# Patient Record
Sex: Female | Born: 1975 | Race: Black or African American | Hispanic: No | Marital: Married | Smoking: Never smoker
Health system: Southern US, Community
[De-identification: ages and names within clinical notes are randomized; demographics above are authoritative.]

## PROBLEM LIST (undated history)

## (undated) DIAGNOSIS — I10 Essential (primary) hypertension: Secondary | ICD-10-CM

## (undated) HISTORY — PX: TUBAL LIGATION: SHX77

## (undated) HISTORY — PX: CHOLECYSTECTOMY: SHX55

---

## 1999-11-13 ENCOUNTER — Emergency Department (HOSPITAL_COMMUNITY): Admission: EM | Admit: 1999-11-13 | Discharge: 1999-11-13 | Payer: Self-pay | Admitting: Emergency Medicine

## 1999-12-12 ENCOUNTER — Emergency Department (HOSPITAL_COMMUNITY): Admission: EM | Admit: 1999-12-12 | Discharge: 1999-12-12 | Payer: Self-pay | Admitting: Emergency Medicine

## 2000-06-20 ENCOUNTER — Other Ambulatory Visit: Admission: RE | Admit: 2000-06-20 | Discharge: 2000-06-20 | Payer: Self-pay | Admitting: Obstetrics and Gynecology

## 2000-08-04 ENCOUNTER — Ambulatory Visit (HOSPITAL_COMMUNITY): Admission: RE | Admit: 2000-08-04 | Discharge: 2000-08-04 | Payer: Self-pay | Admitting: Obstetrics and Gynecology

## 2000-08-04 ENCOUNTER — Encounter: Payer: Self-pay | Admitting: Obstetrics and Gynecology

## 2000-11-20 ENCOUNTER — Encounter: Payer: Self-pay | Admitting: Obstetrics and Gynecology

## 2000-11-20 ENCOUNTER — Ambulatory Visit (HOSPITAL_COMMUNITY): Admission: RE | Admit: 2000-11-20 | Discharge: 2000-11-20 | Payer: Self-pay | Admitting: Obstetrics and Gynecology

## 2000-11-28 ENCOUNTER — Inpatient Hospital Stay (HOSPITAL_COMMUNITY): Admission: AD | Admit: 2000-11-28 | Discharge: 2000-11-28 | Payer: Self-pay | Admitting: Obstetrics and Gynecology

## 2000-12-26 ENCOUNTER — Inpatient Hospital Stay (HOSPITAL_COMMUNITY): Admission: AD | Admit: 2000-12-26 | Discharge: 2000-12-28 | Payer: Self-pay | Admitting: Obstetrics and Gynecology

## 2001-09-16 ENCOUNTER — Other Ambulatory Visit: Admission: RE | Admit: 2001-09-16 | Discharge: 2001-09-16 | Payer: Self-pay | Admitting: Obstetrics and Gynecology

## 2001-09-30 ENCOUNTER — Ambulatory Visit (HOSPITAL_COMMUNITY): Admission: RE | Admit: 2001-09-30 | Discharge: 2001-09-30 | Payer: Self-pay | Admitting: Obstetrics and Gynecology

## 2001-09-30 ENCOUNTER — Encounter: Payer: Self-pay | Admitting: Obstetrics and Gynecology

## 2001-12-06 ENCOUNTER — Inpatient Hospital Stay (HOSPITAL_COMMUNITY): Admission: AD | Admit: 2001-12-06 | Discharge: 2001-12-06 | Payer: Self-pay | Admitting: Obstetrics and Gynecology

## 2002-01-27 ENCOUNTER — Inpatient Hospital Stay (HOSPITAL_COMMUNITY): Admission: AD | Admit: 2002-01-27 | Discharge: 2002-01-29 | Payer: Self-pay | Admitting: Obstetrics and Gynecology

## 2002-05-30 ENCOUNTER — Inpatient Hospital Stay (HOSPITAL_COMMUNITY): Admission: AD | Admit: 2002-05-30 | Discharge: 2002-05-30 | Payer: Self-pay | Admitting: Obstetrics and Gynecology

## 2002-12-09 ENCOUNTER — Encounter: Payer: Self-pay | Admitting: Obstetrics and Gynecology

## 2002-12-09 ENCOUNTER — Ambulatory Visit (HOSPITAL_COMMUNITY): Admission: RE | Admit: 2002-12-09 | Discharge: 2002-12-09 | Payer: Self-pay | Admitting: Obstetrics and Gynecology

## 2014-09-02 DIAGNOSIS — R718 Other abnormality of red blood cells: Secondary | ICD-10-CM | POA: Insufficient documentation

## 2016-11-20 ENCOUNTER — Emergency Department (HOSPITAL_COMMUNITY): Payer: BLUE CROSS/BLUE SHIELD

## 2016-11-20 ENCOUNTER — Emergency Department (HOSPITAL_COMMUNITY)
Admission: EM | Admit: 2016-11-20 | Discharge: 2016-11-20 | Disposition: A | Discharge status: Home / Self Care | Payer: BLUE CROSS/BLUE SHIELD | Attending: Physician Assistant | Admitting: Physician Assistant

## 2016-11-20 ENCOUNTER — Encounter (HOSPITAL_COMMUNITY): Payer: Self-pay | Admitting: Radiology

## 2016-11-20 DIAGNOSIS — R51 Headache: Secondary | ICD-10-CM | POA: Diagnosis not present

## 2016-11-20 DIAGNOSIS — R42 Dizziness and giddiness: Secondary | ICD-10-CM | POA: Diagnosis present

## 2016-11-20 DIAGNOSIS — Z79899 Other long term (current) drug therapy: Secondary | ICD-10-CM | POA: Insufficient documentation

## 2016-11-20 DIAGNOSIS — E876 Hypokalemia: Secondary | ICD-10-CM

## 2016-11-20 LAB — CBC
HCT: 33.4 % — ABNORMAL LOW (ref 36.0–46.0)
HEMOGLOBIN: 10.6 g/dL — AB (ref 12.0–15.0)
MCH: 22.6 pg — AB (ref 26.0–34.0)
MCHC: 31.7 g/dL (ref 30.0–36.0)
MCV: 71.1 fL — ABNORMAL LOW (ref 78.0–100.0)
Platelets: 331 10*3/uL (ref 150–400)
RBC: 4.7 MIL/uL (ref 3.87–5.11)
RDW: 15.3 % (ref 11.5–15.5)
WBC: 4.4 10*3/uL (ref 4.0–10.5)

## 2016-11-20 LAB — URINALYSIS, ROUTINE W REFLEX MICROSCOPIC
BILIRUBIN URINE: NEGATIVE
Bacteria, UA: NONE SEEN
GLUCOSE, UA: NEGATIVE mg/dL
KETONES UR: NEGATIVE mg/dL
Nitrite: NEGATIVE
Protein, ur: NEGATIVE mg/dL
Specific Gravity, Urine: 1.023 (ref 1.005–1.030)
pH: 5 (ref 5.0–8.0)

## 2016-11-20 LAB — CBG MONITORING, ED: Glucose-Capillary: 92 mg/dL (ref 65–99)

## 2016-11-20 LAB — BASIC METABOLIC PANEL
ANION GAP: 9 (ref 5–15)
BUN: 10 mg/dL (ref 6–20)
CALCIUM: 9.1 mg/dL (ref 8.9–10.3)
CHLORIDE: 103 mmol/L (ref 101–111)
CO2: 25 mmol/L (ref 22–32)
Creatinine, Ser: 0.65 mg/dL (ref 0.44–1.00)
GFR calc non Af Amer: >60 mL/min (ref >60)
Glucose, Bld: 129 mg/dL — ABNORMAL HIGH (ref 65–99)
Potassium: 2.9 mmol/L — ABNORMAL LOW (ref 3.5–5.1)
Sodium: 137 mmol/L (ref 135–145)

## 2016-11-20 LAB — I-STAT BETA HCG BLOOD, ED (MC, WL, AP ONLY): I-stat hCG, quantitative: <5 m[IU]/mL (ref <5)

## 2016-11-20 MED ORDER — ONDANSETRON HCL 4 MG/2ML IJ SOLN
4.0000 mg | Freq: Once | INTRAMUSCULAR | Status: AC
  Administered 2016-11-20: 4 mg via INTRAVENOUS
  Filled 2016-11-20: qty 2

## 2016-11-20 MED ORDER — POTASSIUM CHLORIDE CRYS ER 20 MEQ PO TBCR: 20.0000 meq | EXTENDED_RELEASE_TABLET | Freq: Every day | ORAL | 0 refills | Status: AC

## 2016-11-20 MED ORDER — POTASSIUM CHLORIDE CRYS ER 20 MEQ PO TBCR
40.0000 meq | EXTENDED_RELEASE_TABLET | Freq: Once | ORAL | Status: AC
  Administered 2016-11-20: 40 meq via ORAL
  Filled 2016-11-20: qty 2

## 2016-11-20 MED ORDER — SODIUM CHLORIDE 0.9 % IV BOLUS (SEPSIS)
1000.0000 mL | Freq: Once | INTRAVENOUS | Status: AC
  Administered 2016-11-20: 1000 mL via INTRAVENOUS

## 2016-11-20 NOTE — ED Triage Notes (Addendum)
Pt c/o abdominal pain, nausea, headache since waking up this AM. Denies any diarrhea or vomiting. States she is feeling dizzy, like she is spinning and could fall.

## 2016-11-20 NOTE — Discharge Instructions (Signed)
You have low potassium today. This can be caused by one of the medications that you're supposed to be taking, hydrochlorothiazide. Please follow-up with your primary care physician this week. Please take potassium until you can follow-up get your labs rechecked and discuss  changing your blood pressure medication.

## 2016-11-20 NOTE — ED Provider Notes (Signed)
[facility] Provider Note   CSN: [id number] Arrival date & time: 11/20/16  0549     History   Chief Complaint Chief Complaint  Patient presents with  . Headache  . Abdominal Pain    HPI Alexis Haynes is a 41 y.o. female.  HPI   Patient is a 41 year old female presenting with headache. lightheadedness is worse and she goes from sitting to standing. She reports it started today. Patient reports she's not been drinking as much fluid as usual did not have anything except Dr. Reino Kent yesterday at 4 PM. Patient denies any alcohol use. She states she has mild lightheadedness. There is no inducible dizziness with moving her head.   Mild nausea. No vomiting. No diarrhea. No recent medication changes.  History reviewed. No pertinent past medical history.  There are no active problems to display for this patient.   No past surgical history on file.  OB History    No data available       Home Medications    Prior to Admission medications   Medication Sig Start Date End Date Taking? Authorizing Provider  lisinopril-hydrochlorothiazide (PRINZIDE,ZESTORETIC) 10-12.5 MG tablet Take 2 tablets by mouth daily.   Yes [provider]  potassium chloride SA (K-DUR,KLOR-CON) 20 MEQ tablet Take 1 tablet (20 mEq total) by mouth daily. 11/20/16   Garvin Ellena, Cindee Salt, MD    Family History No family history on file.  Social History Social History  Substance Use Topics  . Smoking status: Not on file  . Smokeless tobacco: Not on file  . Alcohol use Not on file     Allergies   Patient has no known allergies.   Review of Systems Review of Systems  Constitutional: Negative for activity change.  Respiratory: Negative for shortness of breath.   Cardiovascular: Negative for chest pain.  Gastrointestinal: Negative for abdominal pain.     Physical Exam Updated Vital Signs BP (!) 148/105 (BP Location: Right Arm)   Pulse 96   Temp 97.9 F (36.6 C) (Oral)    Resp 18   Ht 5\' 5"  (1.651 m)   Wt 224 lb (101.6 kg)   SpO2 97%   BMI 37.28 kg/m   Physical Exam  Constitutional: She is oriented to person, place, and time. She appears well-developed and well-nourished.  obese  HENT:  Head: Normocephalic and atraumatic.  Eyes: Right eye exhibits no discharge.  Cardiovascular: Normal rate, regular rhythm and normal heart sounds.   No murmur heard. Pulmonary/Chest: Effort normal and breath sounds normal. She has no wheezes. She has no rales.  Abdominal: Soft. She exhibits no distension. There is no tenderness.  Neurological: She is oriented to person, place, and time.  Equal strength bilaterally upper and lower extremities negative pronator drift. Normal sensation bilaterally. Speech comprehensible, no slurring. Facial nerve tested and appears grossly normal. Alert and oriented 3.   Skin: Skin is warm and dry. She is not diaphoretic.  Psychiatric: She has a normal mood and affect.  Nursing note and vitals reviewed.    ED Treatments / Results  Labs (all labs ordered are listed, but only abnormal results are displayed) Labs Reviewed  BASIC METABOLIC PANEL - Abnormal; Notable for the following:       Result Value   Potassium 2.9 (*)    Glucose, Bld 129 (*)    All other components within normal limits  CBC - Abnormal; Notable for the following:    Hemoglobin 10.6 (*)    HCT 33.4 (*)  MCV 71.1 (*)    MCH 22.6 (*)    All other components within normal limits  URINALYSIS, ROUTINE W REFLEX MICROSCOPIC - Abnormal; Notable for the following:    APPearance CLOUDY (*)    Hgb urine dipstick LARGE (*)    Leukocytes, UA LARGE (*)    Squamous Epithelial / LPF TOO NUMEROUS TO COUNT (*)    All other components within normal limits  CBG MONITORING, ED  I-STAT BETA HCG BLOOD, ED ([facility], [facility], [facility] ONLY)    EKG  EKG Interpretation None       Radiology Ct Head Wo Contrast  Result Date: 11/20/2016 CLINICAL DATA:  Headache, lightheadedness worse when  going from sitting to standing beginning today, decreased fluid intake EXAM: CT HEAD WITHOUT CONTRAST TECHNIQUE: Contiguous axial images were obtained from the base of the skull through the vertex without intravenous contrast. Sagittal and coronal MPR images reconstructed from axial data set. COMPARISON:  None FINDINGS: Brain: Normal ventricular morphology. No midline shift or mass effect. Normal appearance of brain parenchyma. No intracranial hemorrhage, mass lesion or evidence acute infarction. No extra-axial fluid collections. Vascular: Unremarkable Skull: Intact.  Few scattered nonspecific dural calcifications. Sinuses/Orbits: Clear Other: N/A IMPRESSION: No acute intracranial abnormalities. Electronically Signed   By: Ulyses SouthwardMark  Boles M.D.   On: 11/20/2016 09:14    Procedures Procedures (including critical care time)  Medications Ordered in ED Medications  sodium chloride 0.9 % bolus 1,000 mL (1,000 mLs Intravenous New Bag/Given 11/20/16 0859)  potassium chloride SA (K-DUR,KLOR-CON) CR tablet 40 mEq (40 mEq Oral Given 11/20/16 0900)  ondansetron (ZOFRAN) injection 4 mg (4 mg Intravenous Given 11/20/16 0900)     Initial Impression / Assessment and Plan / ED Course  I have reviewed the triage vital signs and the nursing notes.  Pertinent labs & imaging results that were available during my care of the patient were reviewed by me and considered in my medical decision making (see chart for details).     Patient is a 41 year old female presenting with mild headache and mild lightheadedness when going from sitting to standing. With this represents dehydration. The headache started within the last 2 hours. Therefore CT will rule out any chance of aneurysm. We will give patient fluids and see this symptomatically helps her. Patient's potassium low, we'll replete. We'll have her retested with her primary care physician in a week's time.  \\11:02 AM Patient reports that she occasionally takes  hydrochlorothiazide. We will instruct her to take potassium and then have her labs rechecked and discuss changing her blood pressure medication with her primary care physician.  Final Clinical Impressions(s) / ED Diagnoses   Final diagnoses:  Hypokalemia    New Prescriptions New Prescriptions   POTASSIUM CHLORIDE SA (K-DUR,KLOR-CON) 20 MEQ TABLET    Take 1 tablet (20 mEq total) by mouth daily.     Abelino DerrickMackuen, Shaden Lacher Lyn, MD 11/20/16 1102

## 2016-11-20 NOTE — ED Notes (Signed)
Patient transported to CT 

## 2017-05-15 ENCOUNTER — Ambulatory Visit: Payer: Self-pay | Admitting: Family Medicine

## 2017-05-29 ENCOUNTER — Ambulatory Visit: Payer: Self-pay | Admitting: Family Medicine

## 2017-06-06 ENCOUNTER — Ambulatory Visit (HOSPITAL_COMMUNITY)
Admission: EM | Admit: 2017-06-06 | Discharge: 2017-06-06 | Disposition: A | Discharge status: Home / Self Care | Payer: Medicaid Other | Attending: Internal Medicine | Admitting: Internal Medicine

## 2017-06-06 ENCOUNTER — Encounter (HOSPITAL_COMMUNITY): Payer: Self-pay | Admitting: Emergency Medicine

## 2017-06-06 DIAGNOSIS — J019 Acute sinusitis, unspecified: Secondary | ICD-10-CM

## 2017-06-06 DIAGNOSIS — J029 Acute pharyngitis, unspecified: Secondary | ICD-10-CM

## 2017-06-06 DIAGNOSIS — R05 Cough: Secondary | ICD-10-CM

## 2017-06-06 DIAGNOSIS — R110 Nausea: Secondary | ICD-10-CM

## 2017-06-06 HISTORY — DX: Essential (primary) hypertension: I10

## 2017-06-06 MED ORDER — AMOXICILLIN 500 MG PO CAPS: 500.0000 mg | ORAL_CAPSULE | Freq: Three times a day (TID) | ORAL | 0 refills | Status: AC

## 2017-06-06 NOTE — ED Triage Notes (Signed)
Pt here with URI sx and fever x 3 days

## 2017-06-06 NOTE — Discharge Instructions (Signed)
You likely having a viral upper respiratory infection. We recommended symptom control. I expect your symptoms to start improving in the next 1-2 weeks. I have also sent in an antibiotic for you to fill in 5 days if you are not improving.   1. Take a daily allergy pill like Zyrtec, Claritin, or Store brand (anti-histamine) consistently for 2 weeks  2. For congestion you may try an oral decongestant like Mucinex or sudafed. You may also try flonase or saline irrigations.   3. For your sore throat you may try cepacol lozenges, salt water gargles  4. Take Tylenol or Ibuprofen to help with pain/inflammation  5. Stay Hydrated! Drink plenty of fluids.

## 2017-06-06 NOTE — ED Provider Notes (Signed)
[facility]    CSN: [id number] Arrival date & time: 06/06/17  1417     History   Chief Complaint Chief Complaint  Patient presents with  . URI    HPI Alexis Haynes is a 41 y.o. female presenting with URI symptoms and fever. She has had nasal congestion, headache, facial pressure, loss of appetite, cough, and a hoarse voice for 2-3 days. She also reports loss of appetite and nausea with out vomiting. She has taken over the counter cold symptoms medicine and a generic mucinex. Both seemed to minimilly help her symptoms. No chest pain or belly pain.    She has PMH of HTN, has stopped her medications over the past 2 days for concern of medication interference.  HPI  Past Medical History:  Diagnosis Date  . Hypertension     There are no active problems to display for this patient.   History reviewed. No pertinent surgical history.  OB History    No data available       Home Medications    Prior to Admission medications   Medication Sig Start Date End Date Taking? Authorizing Provider  amoxicillin (AMOXIL) 500 MG capsule Take 1 capsule (500 mg total) by mouth 3 (three) times daily for 5 days. Please do not fill until 11/26 06/06/17 06/11/17  Masen Luallen, Fran LowesHallie C, PA-C  lisinopril-hydrochlorothiazide (PRINZIDE,ZESTORETIC) 10-12.5 MG tablet Take 2 tablets by mouth daily.    [provider]  potassium chloride SA (K-DUR,KLOR-CON) 20 MEQ tablet Take 1 tablet (20 mEq total) by mouth daily. 11/20/16   Mackuen, Cindee Saltourteney Lyn, MD    Family History History reviewed. No pertinent family history.  Social History Social History   Tobacco Use  . Smoking status: Never Smoker  . Smokeless tobacco: Never Used  Substance Use Topics  . Alcohol use: No    Frequency: Never  . Drug use: No     Allergies   Patient has no known allergies.   Review of Systems Review of Systems  Constitutional: Positive for appetite change, chills and fever.  HENT:  Positive for congestion, rhinorrhea, sinus pressure and sore throat. Negative for ear pain and trouble swallowing.   Respiratory: Positive for cough. Negative for chest tightness.   Cardiovascular: Negative for chest pain.  Gastrointestinal: Positive for nausea. Negative for abdominal pain and vomiting.  Musculoskeletal: Negative for myalgias.     Physical Exam Triage Vital Signs ED Triage Vitals [06/06/17 1432]  Enc Vitals Group     BP (!) 149/100     Pulse Rate (!) 113     Resp 18     Temp 99.3 F (37.4 C)     Temp Source Oral     SpO2 100 %     Weight      Height      Head Circumference      Peak Flow      Pain Score      Pain Loc      Pain Edu?      Excl. in GC?    No data found.  Updated Vital Signs BP (!) 149/100 (BP Location: Right Arm)   Pulse (!) 113   Temp 99.3 F (37.4 C) (Oral)   Resp 18   SpO2 100%    Physical Exam  Constitutional: She is oriented to person, place, and time. She appears well-developed and well-nourished.  Appearing moderately ill and uncomfortable.  HENT:  Head: Normocephalic and atraumatic.  Right Ear: Tympanic membrane and ear  canal normal.  Left Ear: Tympanic membrane and ear canal normal.  Nose: Rhinorrhea present. Right sinus exhibits maxillary sinus tenderness. Left sinus exhibits maxillary sinus tenderness.  Mouth/Throat: Oropharyngeal exudate present.  Eyes: EOM are normal.  Neck: Normal range of motion.  Cardiovascular: Regular rhythm.  No murmur heard. tachycardic  Pulmonary/Chest: Effort normal and breath sounds normal. No stridor. She has no wheezes. She has no rales.  Abdominal: Soft. She exhibits no distension. There is no tenderness.  Lymphadenopathy:    She has no cervical adenopathy.  Neurological: She is alert and oriented to person, place, and time.  Skin: Skin is warm.      UC Treatments / Results  Labs (all labs ordered are listed, but only abnormal results are displayed) Labs Reviewed - No data to  display  EKG  EKG Interpretation None       Radiology No results found.  Procedures Procedures (including critical care time)  Medications Ordered in UC Medications - No data to display   Initial Impression / Assessment and Plan / UC Course  I have reviewed the triage vital signs and the nursing notes.  Pertinent labs & imaging results that were available during my care of the patient were reviewed by me and considered in my medical decision making (see chart for details).    Viral URI vs sinusitis vs. Flu  Patient was recommended symptomatic treatment as her symptoms have only persisted 2-3 days. She was given amoxicillin to fill in 5 days if she is not feeling any better. Continue Tylenol or ibuprofen for fever/pain/inflammation. Encouraged to stay hydrated and eat blander foods that may sit better on her stomach. Continue symptom control with anti-histamine, decongestant, flonase, saline irrigation. Advised to return if symptoms not improving in 1 week.   Elevated pressure and HR: may be related to not take BP medication for past 2 days. Advised to begin today and monitor.   Final Clinical Impressions(s) / UC Diagnoses   Final diagnoses:  Acute non-recurrent sinusitis, unspecified location    ED Discharge Orders        Ordered    amoxicillin (AMOXIL) 500 MG capsule  3 times daily     06/06/17 1634       Controlled Substance Prescriptions [place] Controlled Substance Registry consulted? Not Applicable   Lew DawesWieters, Niesha Bame C, PA-C 06/06/17 [address]1650    Dawood Spitler, [place] SquareHallie C, [place]PA-C 06/06/17 1733

## 2017-09-17 ENCOUNTER — Ambulatory Visit: Payer: Self-pay | Admitting: Physician Assistant

## 2017-09-19 ENCOUNTER — Other Ambulatory Visit: Payer: Self-pay

## 2017-09-19 ENCOUNTER — Ambulatory Visit (INDEPENDENT_AMBULATORY_CARE_PROVIDER_SITE_OTHER): Payer: Commercial Managed Care - PPO | Admitting: Physician Assistant

## 2017-09-19 ENCOUNTER — Encounter: Payer: Self-pay | Admitting: Physician Assistant

## 2017-09-19 VITALS — BP 128/104 | HR 94 | Temp 98.9°F | Resp 16 | Ht 65.0 in | Wt 229.8 lb

## 2017-09-19 DIAGNOSIS — I10 Essential (primary) hypertension: Secondary | ICD-10-CM | POA: Diagnosis not present

## 2017-09-19 DIAGNOSIS — Z13220 Encounter for screening for lipoid disorders: Secondary | ICD-10-CM

## 2017-09-19 DIAGNOSIS — E669 Obesity, unspecified: Secondary | ICD-10-CM

## 2017-09-19 DIAGNOSIS — Z6838 Body mass index (BMI) 38.0-38.9, adult: Secondary | ICD-10-CM

## 2017-09-19 DIAGNOSIS — E66812 Obesity, class 2: Secondary | ICD-10-CM

## 2017-09-19 MED ORDER — LISINOPRIL-HYDROCHLOROTHIAZIDE 20-12.5 MG PO TABS: 1.0000 | ORAL_TABLET | Freq: Every day | ORAL | 3 refills | Status: AC

## 2017-09-19 NOTE — Patient Instructions (Addendum)
Please call Dr. Philmore Pali office for follow-up on your anemia - find a new provider in the area.  Phone: [phone]   See below for food tips to help lower your blood pressure.  Exercise also helps lower your blood pressure. Try and start walking 10-30 min daily.  STOP taking lisinopril-HCTZ 10-12.5mg .  START TAKING lisinopril-HCTZ 20-12.5mg  once daily.  Come back and see me in 4-6 weeks for blood pressure recheck.   DASH Eating Plan DASH stands for "Dietary Approaches to Stop Hypertension." The DASH eating plan is a healthy eating plan that has been shown to reduce high blood pressure (hypertension). It may also reduce your risk for type 2 diabetes, heart disease, and stroke. The DASH eating plan may also help with weight loss. What are tips for following this plan? General guidelines  Avoid eating more than 2,300 mg (milligrams) of salt (sodium) a day. If you have hypertension, you may need to reduce your sodium intake to 1,500 mg a day.  Limit alcohol intake to no more than 1 drink a day for nonpregnant women and 2 drinks a day for men. One drink equals 12 oz of beer, 5 oz of wine, or 1 oz of hard liquor.  Work with your health care provider to maintain a healthy body weight or to lose weight. Ask what an ideal weight is for you.  Get at least 30 minutes of exercise that causes your heart to beat faster (aerobic exercise) most days of the week. Activities may include walking, swimming, or biking.  Work with your health care provider or diet and nutrition specialist (dietitian) to adjust your eating plan to your individual calorie needs. Reading food labels  Check food labels for the amount of sodium per serving. Choose foods with less than 5 percent of the Daily Value of sodium. Generally, foods with less than 300 mg of sodium per serving fit into this eating plan.  To find whole grains, look for the word "whole" as the first word in the ingredient list. Shopping  Buy products  labeled as "low-sodium" or "no salt added."  Buy fresh foods. Avoid canned foods and premade or frozen meals. Cooking  Avoid adding salt when cooking. Use salt-free seasonings or herbs instead of table salt or sea salt. Check with your health care provider or pharmacist before using salt substitutes.  Do not fry foods. Cook foods using healthy methods such as baking, boiling, grilling, and broiling instead.  Cook with heart-healthy oils, such as olive, canola, soybean, or sunflower oil. Meal planning   Eat a balanced diet that includes: ? 5 or more servings of fruits and vegetables each day. At each meal, try to fill half of your plate with fruits and vegetables. ? Up to 6-8 servings of whole grains each day. ? Less than 6 oz of lean meat, poultry, or fish each day. A 3-oz serving of meat is about the same size as a deck of cards. One egg equals 1 oz. ? 2 servings of low-fat dairy each day. ? A serving of nuts, seeds, or beans 5 times each week. ? Heart-healthy fats. Healthy fats called Omega-3 fatty acids are found in foods such as flaxseeds and coldwater fish, like sardines, salmon, and mackerel.  Limit how much you eat of the following: ? Canned or prepackaged foods. ? Food that is high in trans fat, such as fried foods. ? Food that is high in saturated fat, such as fatty meat. ? Sweets, desserts, sugary drinks, and other foods  with added sugar. ? Full-fat dairy products.  Do not salt foods before eating.  Try to eat at least 2 vegetarian meals each week.  Eat more home-cooked food and less restaurant, buffet, and fast food.  When eating at a restaurant, ask that your food be prepared with less salt or no salt, if possible. What foods are recommended? The items listed may not be a complete list. Talk with your dietitian about what dietary choices are best for you. Grains Whole-grain or whole-wheat bread. Whole-grain or whole-wheat pasta. Brown rice. [place]ena Morrow. Bulgur.  Whole-grain and low-sodium cereals. Pita bread. Low-fat, low-sodium crackers. Whole-wheat flour tortillas. Vegetables Fresh or frozen vegetables (raw, steamed, roasted, or grilled). Low-sodium or reduced-sodium tomato and vegetable juice. Low-sodium or reduced-sodium tomato sauce and tomato paste. Low-sodium or reduced-sodium canned vegetables. Fruits All fresh, dried, or frozen fruit. Canned fruit in natural juice (without added sugar). Meat and other protein foods Skinless chicken or [place]. Ground chicken or [place]. Pork with fat trimmed off. Fish and seafood. Egg whites. Dried beans, peas, or lentils. Unsalted nuts, nut butters, and seeds. Unsalted canned beans. Lean cuts of beef with fat trimmed off. Low-sodium, lean deli meat. Dairy Low-fat (1%) or fat-free (skim) milk. Fat-free, low-fat, or reduced-fat cheeses. Nonfat, low-sodium ricotta or cottage cheese. Low-fat or nonfat yogurt. Low-fat, low-sodium cheese. Fats and oils Soft margarine without trans fats. Vegetable oil. Low-fat, reduced-fat, or light mayonnaise and salad dressings (reduced-sodium). Canola, safflower, olive, soybean, and sunflower oils. Avocado. Seasoning and other foods Herbs. Spices. Seasoning mixes without salt. Unsalted popcorn and pretzels. Fat-free sweets. What foods are not recommended? The items listed may not be a complete list. Talk with your dietitian about what dietary choices are best for you. Grains Baked goods made with fat, such as croissants, muffins, or some breads. Dry pasta or rice meal packs. Vegetables Creamed or fried vegetables. Vegetables in a cheese sauce. Regular canned vegetables (not low-sodium or reduced-sodium). Regular canned tomato sauce and paste (not low-sodium or reduced-sodium). Regular tomato and vegetable juice (not low-sodium or reduced-sodium). Angie Fava. Olives. Fruits Canned fruit in a light or heavy syrup. Fried fruit. Fruit in cream or butter sauce. Meat and other protein  foods Fatty cuts of meat. Ribs. Fried meat. Berniece Salines. Sausage. Bologna and other processed lunch meats. Salami. Fatback. Hotdogs. Bratwurst. Salted nuts and seeds. Canned beans with added salt. Canned or smoked fish. Whole eggs or egg yolks. Chicken or [place] with skin. Dairy Whole or 2% milk, cream, and half-and-half. Whole or full-fat cream cheese. Whole-fat or sweetened yogurt. Full-fat cheese. Nondairy creamers. Whipped toppings. Processed cheese and cheese spreads. Fats and oils Butter. Stick margarine. Lard. Shortening. Ghee. Bacon fat. Tropical oils, such as coconut, palm kernel, or palm oil. Seasoning and other foods Salted popcorn and pretzels. Onion salt, garlic salt, seasoned salt, table salt, and sea salt. Worcestershire sauce. Tartar sauce. Barbecue sauce. Teriyaki sauce. Soy sauce, including reduced-sodium. Steak sauce. Canned and packaged gravies. Fish sauce. Oyster sauce. Cocktail sauce. Horseradish that you find on the shelf. Ketchup. Mustard. Meat flavorings and tenderizers. Bouillon cubes. Hot sauce and Tabasco sauce. Premade or packaged marinades. Premade or packaged taco seasonings. Relishes. Regular salad dressings. Where to find more information:  National Heart, Lung, and [place]: https://wilson-eaton.com/  American Heart Association: www.heart.org Summary  The DASH eating plan is a healthy eating plan that has been shown to reduce high blood pressure (hypertension). It may also reduce your risk for type 2 diabetes, heart disease, and stroke.  With the  DASH eating plan, you should limit salt (sodium) intake to 2,300 mg a day. If you have hypertension, you may need to reduce your sodium intake to 1,500 mg a day.  When on the DASH eating plan, aim to eat more fresh fruits and vegetables, whole grains, lean proteins, low-fat dairy, and heart-healthy fats.  Work with your health care provider or diet and nutrition specialist (dietitian) to adjust your eating plan to your  individual calorie needs. This information is not intended to replace advice given to you by your health care provider. Make sure you discuss any questions you have with your health care provider. Document Released: 06/20/2011 Document Revised: 06/24/2016 Document Reviewed: 06/24/2016 Elsevier Interactive Patient Education  2018 [employer]  IF you received an x-ray today, you will receive an invoice from [facility]. Please contact [facility] at [phone] with questions or concerns regarding your invoice.   IF you received labwork today, you will receive an invoice from [facility]. Please contact [facility] at [phone] with questions or concerns regarding your invoice.   Our billing staff will not be able to assist you with questions regarding bills from these companies.  You will be contacted with the lab results as soon as they are available. The fastest way to get your results is to activate your My Chart account. Instructions are located on the last page of this paperwork. If you have not heard from us regarding the results in 2 weeks, please contact this office.

## 2017-09-19 NOTE — Progress Notes (Signed)
   Alexis Haynes  MRN: [id number] DOB: [birth year]  PCP: Patient, No Pcp Per  Subjective:  Pt is a 42 year old female PMH microcytic anemia, HTN who presents to clinic for HTN f/u.   HTN - Lisinopril-HCTZ 10-12.5 mg she is taking 2 tabs daily. Today's blood pressure is 130/92. Last dose was yesterday. Does not take bp's outside of clinic. She has been trying to stretch her medication out and taking one pill once daily. When she takes 2 pills she urinates more frequently.  She is not fasting today - last ate about 4 hours ago.  Does not eat a healthy diet Does not exercise.  Denies cardiac symptoms. Received care in [place], however recently moved to [place].   Review of Systems  Constitutional: Negative for chills, diaphoresis, fatigue and fever.  Respiratory: Negative for cough, chest tightness, shortness of breath and wheezing.   Cardiovascular: Negative for chest pain and palpitations.  Gastrointestinal: Negative for abdominal pain, diarrhea, nausea and vomiting.  Genitourinary: Positive for frequency.  Neurological: Negative for weakness, light-headedness and headaches.    There are no active problems to display for this patient.   Current Outpatient Medications on File Prior to Visit  Medication Sig Dispense Refill  . lisinopril-hydrochlorothiazide (PRINZIDE,ZESTORETIC) 10-12.5 MG tablet Take 2 tablets by mouth daily.    . potassium chloride SA (K-DUR,KLOR-CON) 20 MEQ tablet Take 1 tablet (20 mEq total) by mouth daily. (Patient not taking: Reported on 09/19/2017) 15 tablet 0   No current facility-administered medications on file prior to visit.     No Known Allergies   Objective:  BP (!) 130/92   Pulse 94   Temp 98.9 F (37.2 C) (Oral)   Resp 16   Ht '5\' 5"'$  (1.651 m)   Wt 229 lb 12.8 oz (104.2 kg)   SpO2 99%   BMI 38.24 kg/m   Physical Exam  Constitutional: She is oriented to person, place, and time and well-developed, well-nourished, and in no distress. No  distress.  obese  Cardiovascular: Normal rate, regular rhythm and normal heart sounds.  Neurological: She is alert and oriented to person, place, and time. GCS score is 15.  Skin: Skin is warm and dry.  Psychiatric: Mood, memory, affect and judgment normal.  Vitals reviewed.   Assessment and Plan :  1. Essential hypertension - CMP14+EGFR - POCT urinalysis dipstick - lisinopril-hydrochlorothiazide (ZESTORETIC) 20-12.5 MG tablet; Take 1 tablet by mouth daily.  Dispense: 30 tablet; Refill: 3 - Pt presents for HTN f/u. She is a new patient here. She has a h/o microcytic anemia which she was followed by oncology at [facility]. She has yet to find provider here - number provided for her former provider at [facility], she will call to find referral. Routine labs are pending, will contact with results. She has not been taking HTN medication as directed. Today's blood pressure is 128/104. Plan to switch dose to Zestoretic 20-12.'5mg'$ . RTC in 4 weeks for bp recheck.  2. Screening, lipid 3. Class 2 obesity with body mass index (BMI) of 38.0 to 38.9 in adult, unspecified obesity type, unspecified whether serious comorbidity present - Lipid panel   Mercer Pod, PA-C  [facility] 09/19/2017 4:28 PM

## 2017-09-20 LAB — CMP14+EGFR
ALT: 24 IU/L (ref 0–32)
AST: 20 IU/L (ref 0–40)
Albumin/Globulin Ratio: 1.4 (ref 1.2–2.2)
Albumin: 4.4 g/dL (ref 3.5–5.5)
Alkaline Phosphatase: 85 IU/L (ref 39–117)
BUN/Creatinine Ratio: 9 (ref 9–23)
BUN: 6 mg/dL (ref 6–24)
Bilirubin Total: <0.2 mg/dL (ref 0.0–1.2)
CO2: 23 mmol/L (ref 20–29)
Calcium: 9.7 mg/dL (ref 8.7–10.2)
Chloride: 101 mmol/L (ref 96–106)
Creatinine, Ser: 0.65 mg/dL (ref 0.57–1.00)
GFR calc Af Amer: 128 mL/min/{1.73_m2} (ref >59)
GFR calc non Af Amer: 111 mL/min/{1.73_m2} (ref >59)
Globulin, Total: 3.2 g/dL (ref 1.5–4.5)
Glucose: 115 mg/dL — ABNORMAL HIGH (ref 65–99)
Potassium: 3.8 mmol/L (ref 3.5–5.2)
Sodium: 139 mmol/L (ref 134–144)
Total Protein: 7.6 g/dL (ref 6.0–8.5)

## 2017-09-20 LAB — LIPID PANEL
Chol/HDL Ratio: 4.3 ratio (ref 0.0–4.4)
Cholesterol, Total: 197 mg/dL (ref 100–199)
HDL: 46 mg/dL (ref >39)
LDL Calculated: 120 mg/dL — ABNORMAL HIGH (ref 0–99)
Triglycerides: 154 mg/dL — ABNORMAL HIGH (ref 0–149)
VLDL Cholesterol Cal: 31 mg/dL (ref 5–40)

## 2017-09-23 ENCOUNTER — Encounter: Payer: Self-pay | Admitting: Physician Assistant

## 2017-09-23 NOTE — Progress Notes (Signed)
Result letter sent in mail. Advised improved diet and exercise. RTC in one year for annual and fasting labs.

## 2017-10-28 ENCOUNTER — Telehealth: Payer: Self-pay | Admitting: Physician Assistant

## 2017-10-28 NOTE — Telephone Encounter (Signed)
Copied from CRM [phone]. Topic: Quick Communication - Lab Results >> Oct 28, 2017  3:37 PM Alexis Haynes, Alexis Haynes wrote: Patient called and said that she would [facility] C[facility] regarding labs that she had done from her visit in March. Please call patient back, thanks.

## 2017-10-29 NOTE — Telephone Encounter (Signed)
Pt given results per notes of Alexis ShoutsElizabeth Haynes, [place] on 09/23/17, patient verbalized understanding and says she never received her lab results in the mail.  I asked about scheduling the yearly physical appointment for anytime after 09/20/2018, she says she will call back later for the appointment.

## 2017-10-29 NOTE — Telephone Encounter (Signed)
Phone call to patient. Unable to reach. If patient calls back, please ask what questions she had regarding her labs.

## 2017-11-03 ENCOUNTER — Emergency Department (HOSPITAL_COMMUNITY)
Admission: EM | Admit: 2017-11-03 | Discharge: 2017-11-04 | Disposition: A | Discharge status: Home / Self Care | Payer: Commercial Managed Care - PPO | Attending: Emergency Medicine | Admitting: Emergency Medicine

## 2017-11-03 ENCOUNTER — Other Ambulatory Visit: Payer: Self-pay

## 2017-11-03 ENCOUNTER — Emergency Department (HOSPITAL_COMMUNITY): Payer: Commercial Managed Care - PPO

## 2017-11-03 ENCOUNTER — Encounter (HOSPITAL_COMMUNITY): Payer: Self-pay | Admitting: Emergency Medicine

## 2017-11-03 DIAGNOSIS — I10 Essential (primary) hypertension: Secondary | ICD-10-CM | POA: Insufficient documentation

## 2017-11-03 DIAGNOSIS — Y92410 Unspecified street and highway as the place of occurrence of the external cause: Secondary | ICD-10-CM | POA: Diagnosis not present

## 2017-11-03 DIAGNOSIS — Y939 Activity, unspecified: Secondary | ICD-10-CM | POA: Insufficient documentation

## 2017-11-03 DIAGNOSIS — Z79899 Other long term (current) drug therapy: Secondary | ICD-10-CM | POA: Diagnosis not present

## 2017-11-03 DIAGNOSIS — Y999 Unspecified external cause status: Secondary | ICD-10-CM | POA: Diagnosis not present

## 2017-11-03 DIAGNOSIS — S161XXA Strain of muscle, fascia and tendon at neck level, initial encounter: Secondary | ICD-10-CM | POA: Insufficient documentation

## 2017-11-03 DIAGNOSIS — S199XXA Unspecified injury of neck, initial encounter: Secondary | ICD-10-CM | POA: Diagnosis present

## 2017-11-03 MED ORDER — METHOCARBAMOL 500 MG PO TABS: 500.0000 mg | ORAL_TABLET | Freq: Two times a day (BID) | ORAL | 0 refills | Status: AC
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
## 2017-11-11 ENCOUNTER — Telehealth: Payer: Self-pay | Admitting: Oncology

## 2017-11-11 ENCOUNTER — Encounter: Payer: Self-pay | Admitting: Oncology

## 2017-11-11 NOTE — Telephone Encounter (Signed)
Appt has been scheduled for the pt to see Dr. Clelia Croft on 5/24 at 11am. Pt aware to arrive 30 minutes early. Pt is a transfer from [facility]. Letter mailed.

## 2017-11-14 ENCOUNTER — Telehealth: Payer: Self-pay | Admitting: Hematology and Oncology

## 2017-11-14 ENCOUNTER — Encounter: Payer: Self-pay | Admitting: Hematology and Oncology

## 2017-11-14 NOTE — Telephone Encounter (Signed)
A provider change has been made to the pt's appt. Pt will now be seeing Dr. Gweneth Dimitri instead of Dr. Clelia Croft on 5/24 at 11am. Letter mailed to the pt.

## 2017-12-01 ENCOUNTER — Inpatient Hospital Stay: Payer: Commercial Managed Care - PPO

## 2017-12-01 ENCOUNTER — Inpatient Hospital Stay: Payer: Commercial Managed Care - PPO | Attending: Hematology and Oncology | Admitting: Hematology and Oncology

## 2017-12-01 ENCOUNTER — Encounter: Payer: Self-pay | Admitting: Hematology and Oncology

## 2017-12-01 VITALS — BP 131/102 | HR 99 | Temp 98.7°F | Resp 18 | Ht 68.0 in | Wt 231.0 lb

## 2017-12-01 DIAGNOSIS — R700 Elevated erythrocyte sedimentation rate: Secondary | ICD-10-CM | POA: Diagnosis not present

## 2017-12-01 DIAGNOSIS — R109 Unspecified abdominal pain: Secondary | ICD-10-CM | POA: Diagnosis not present

## 2017-12-01 DIAGNOSIS — N920 Excessive and frequent menstruation with regular cycle: Secondary | ICD-10-CM | POA: Diagnosis not present

## 2017-12-01 DIAGNOSIS — I10 Essential (primary) hypertension: Secondary | ICD-10-CM

## 2017-12-01 DIAGNOSIS — K921 Melena: Secondary | ICD-10-CM | POA: Diagnosis not present

## 2017-12-01 DIAGNOSIS — D509 Iron deficiency anemia, unspecified: Secondary | ICD-10-CM | POA: Diagnosis not present

## 2017-12-01 DIAGNOSIS — Z79899 Other long term (current) drug therapy: Secondary | ICD-10-CM | POA: Diagnosis not present

## 2017-12-01 LAB — SEDIMENTATION RATE: Sed Rate: 32 mm/hr — ABNORMAL HIGH (ref 0–22)

## 2017-12-01 LAB — CMP (CANCER CENTER ONLY)
ALBUMIN: 3.8 g/dL (ref 3.5–5.0)
ALK PHOS: 87 U/L (ref 40–150)
ALT: 22 U/L (ref 0–55)
ANION GAP: 7 (ref 3–11)
AST: 14 U/L (ref 5–34)
BUN: 6 mg/dL — ABNORMAL LOW (ref 7–26)
CO2: 26 mmol/L (ref 22–29)
Calcium: 9.6 mg/dL (ref 8.4–10.4)
Chloride: 109 mmol/L (ref 98–109)
Creatinine: 0.7 mg/dL (ref 0.60–1.10)
GFR, Est AFR Am: >60 mL/min (ref >60)
GFR, Estimated: >60 mL/min (ref >60)
GLUCOSE: 106 mg/dL (ref 70–140)
POTASSIUM: 3.6 mmol/L (ref 3.5–5.1)
Sodium: 142 mmol/L (ref 136–145)
TOTAL PROTEIN: 7.7 g/dL (ref 6.4–8.3)
Total Bilirubin: <0.2 mg/dL — ABNORMAL LOW (ref 0.2–1.2)

## 2017-12-01 LAB — CBC WITH DIFFERENTIAL (CANCER CENTER ONLY)
BASOS ABS: 0.1 10*3/uL (ref 0.0–0.1)
Basophils Relative: 1 %
Eosinophils Absolute: 0.2 10*3/uL (ref 0.0–0.5)
Eosinophils Relative: 3 %
HEMATOCRIT: 32.9 % — AB (ref 34.8–46.6)
HEMOGLOBIN: 10.4 g/dL — AB (ref 11.6–15.9)
LYMPHS PCT: 41 %
Lymphs Abs: 2.1 10*3/uL (ref 0.9–3.3)
MCH: 22.4 pg — ABNORMAL LOW (ref 25.1–34.0)
MCHC: 31.7 g/dL (ref 31.5–36.0)
MCV: 70.9 fL — AB (ref 79.5–101.0)
MONO ABS: 0.2 10*3/uL (ref 0.1–0.9)
MONOS PCT: 5 %
NEUTROS ABS: 2.5 10*3/uL (ref 1.5–6.5)
NEUTROS PCT: 50 %
Platelet Count: 388 10*3/uL (ref 145–400)
RBC: 4.64 MIL/uL (ref 3.70–5.45)
RDW: 16.6 % — AB (ref 11.2–14.5)
WBC Count: 5.1 10*3/uL (ref 3.9–10.3)

## 2017-12-01 LAB — C-REACTIVE PROTEIN: CRP: <0.8 mg/dL (ref <1.0)

## 2017-12-02 LAB — IRON AND TIBC
IRON: 20 ug/dL — AB (ref 41–142)
Saturation Ratios: 7 % — ABNORMAL LOW (ref 21–57)
TIBC: 288 ug/dL (ref 236–444)
UIBC: 268 ug/dL

## 2017-12-02 LAB — ANTINUCLEAR ANTIBODIES, IFA: ANTINUCLEAR ANTIBODIES, IFA: NEGATIVE

## 2017-12-02 LAB — C3 AND C4
C3 COMPLEMENT: 146 mg/dL (ref 82–167)
COMPLEMENT C4, BODY FLUID: 34 mg/dL (ref 14–44)

## 2017-12-02 LAB — FERRITIN: FERRITIN: 85 ng/mL (ref 9–269)

## 2017-12-02 LAB — CERULOPLASMIN: Ceruloplasmin: 28.4 mg/dL (ref 19.0–39.0)

## 2017-12-02 LAB — RHEUMATOID FACTOR

## 2017-12-03 LAB — ZINC: Zinc: 84 ug/dL (ref 56–134)

## 2017-12-03 LAB — COPPER, SERUM: COPPER: 121 ug/dL (ref 72–166)

## 2017-12-05 ENCOUNTER — Encounter: Payer: Medicaid Other | Admitting: Oncology

## 2017-12-05 ENCOUNTER — Encounter: Payer: Medicaid Other | Admitting: Hematology and Oncology

## 2017-12-11 ENCOUNTER — Inpatient Hospital Stay (HOSPITAL_BASED_OUTPATIENT_CLINIC_OR_DEPARTMENT_OTHER): Payer: Commercial Managed Care - PPO | Admitting: Hematology and Oncology

## 2017-12-11 ENCOUNTER — Encounter: Payer: Self-pay | Admitting: Hematology and Oncology

## 2017-12-11 ENCOUNTER — Inpatient Hospital Stay: Payer: Commercial Managed Care - PPO

## 2017-12-11 VITALS — BP 145/109 | HR 100 | Temp 98.8°F | Resp 20 | Ht 68.0 in | Wt 237.4 lb

## 2017-12-11 DIAGNOSIS — Z79899 Other long term (current) drug therapy: Secondary | ICD-10-CM

## 2017-12-11 DIAGNOSIS — R109 Unspecified abdominal pain: Secondary | ICD-10-CM

## 2017-12-11 DIAGNOSIS — I10 Essential (primary) hypertension: Secondary | ICD-10-CM

## 2017-12-11 DIAGNOSIS — K921 Melena: Secondary | ICD-10-CM

## 2017-12-11 DIAGNOSIS — D509 Iron deficiency anemia, unspecified: Secondary | ICD-10-CM | POA: Diagnosis not present

## 2017-12-11 DIAGNOSIS — N920 Excessive and frequent menstruation with regular cycle: Secondary | ICD-10-CM | POA: Diagnosis not present

## 2017-12-11 DIAGNOSIS — R700 Elevated erythrocyte sedimentation rate: Secondary | ICD-10-CM

## 2017-12-11 LAB — TRANSFERRIN: Transferrin: 230 mg/dL (ref 192–382)

## 2017-12-11 MED ORDER — FERROUS SULFATE 325 (65 FE) MG PO TBEC: 325.0000 mg | DELAYED_RELEASE_TABLET | Freq: Every day | ORAL | 0 refills | Status: AC

## 2017-12-11 NOTE — Assessment & Plan Note (Signed)
42 y.o. female with long-standing microcytosis and hypochromia in the setting of mild anemia.  Differential diagnosis includes chronic iron deficiency with ferritin overestimating iron content due to persistent/chronic inflammatory reaction versus presence of a hemoglobinopathy such as thalassemia.  Plan: -Repeat lab work today. -Referral for gastroenterology for possible EGD and colonoscopy due to hematochezia. - Return to clinic in 1 week to discuss the findings with possible IV iron supplementation

## 2017-12-11 NOTE — Progress Notes (Signed)
[facility] New Visit:  Assessment: Microcytic anemia 42 y.o. female with long-standing microcytosis and hypochromia in the setting of mild anemia.  Differential diagnosis includes chronic iron deficiency with ferritin overestimating iron content due to persistent/chronic inflammatory reaction versus presence of a hemoglobinopathy such as thalassemia.  Plan: -Repeat lab work today. -Referral for gastroenterology for possible EGD and colonoscopy due to hematochezia. - Return to clinic in 1 week to discuss the findings with possible IV iron supplementation   Voice recognition software was used and creation of this note. Despite my best effort at editing the text, some misspelling/errors may have occurred. Orders Placed This Encounter  Procedures  . CBC with Differential (Cancer Center Only)    Standing Status:   Future    Number of Occurrences:   1    Standing Expiration Date:   12/02/2018  . CMP ([facility] only)    Standing Status:   Future    Number of Occurrences:   1    Standing Expiration Date:   12/02/2018  . Iron and TIBC    Standing Status:   Future    Number of Occurrences:   1    Standing Expiration Date:   12/02/2018  . Ferritin    Standing Status:   Future    Number of Occurrences:   1    Standing Expiration Date:   12/02/2018  . Copper, serum    Standing Status:   Future    Number of Occurrences:   1    Standing Expiration Date:   12/02/2018  . Ceruloplasmin    Standing Status:   Future    Number of Occurrences:   1    Standing Expiration Date:   12/02/2018  . Zinc    Standing Status:   Future    Number of Occurrences:   1    Standing Expiration Date:   12/01/2018  . Sedimentation rate    Standing Status:   Future    Number of Occurrences:   1    Standing Expiration Date:   12/01/2018  . C-reactive protein    Standing Status:   Future    Number of Occurrences:   1    Standing Expiration Date:   12/01/2018  . ANA, IFA (with reflex)   Standing Status:   Future    Number of Occurrences:   1    Standing Expiration Date:   12/01/2018  . Rheumatoid factor    Standing Status:   Future    Number of Occurrences:   1    Standing Expiration Date:   12/01/2018  . C3 and C4    Standing Status:   Future    Number of Occurrences:   1    Standing Expiration Date:   12/02/2018    All questions were answered.  . The patient knows to call the clinic with any problems, questions or concerns.  This note was electronically signed.    History of Presenting Illness Alexis Haynes 42 y.o. presenting to the [facility] for microcytic hypochromic anemia, referred by Dr Gardiner Coins.  Patient's past medical history significant for hypertension.  She does have a long-term history of microcytosis with mild anemia.  Previously has been treated with IV iron without significant response at the time.  Suspicion was made for possible presence of thalassemia and genetic testing for both alpha and beta thalassemia was obtained by [facility] system.  Results of the testing are  not accessible through [facility].  Appears that patient was referred to [place] for continued hematological monitoring with the most recent hematological profile available dating back to May or June 2018.  Patient overall reports being at the baseline state of health without any new significant symptoms.  Patient does report occasional blood in stool and had a colonoscopy most recently about 2 years ago.  The hematochezia was attributed to hemorrhoids.  Patient has regular menstrual periods occurring on a monthly basis with the last one starting 3 days ago.  Usually periods last for 4 days with the current periods being heavier than average and associated with some abdominal cramping.   Oncological/hematological History: --Labs, 11/20/16: WBC 4.4, Hgb 10.6, MCV 71.1, MCH 22.6, MCHC 31.7, RDW 15.3, Plt 331  Medical History: Past Medical History:  Diagnosis  Date  . Hypertension     Surgical History: Past Surgical History:  Procedure Laterality Date  . CHOLECYSTECTOMY    . TUBAL LIGATION      Family History: Family History  Problem Relation Age of Onset  . Hypertension Paternal Grandmother   . Hypertension Mother     Social History: Social History   Socioeconomic History  . Marital status: Married    Spouse name: Not on file  . Number of children: Not on file  . Years of education: Not on file  . Highest education level: Not on file  Occupational History  . Not on file  Social Needs  . Financial resource strain: Not on file  . Food insecurity:    Worry: Not on file    Inability: Not on file  . Transportation needs:    Medical: Not on file    Non-medical: Not on file  Tobacco Use  . Smoking status: Never Smoker  . Smokeless tobacco: Never Used  Substance and Sexual Activity  . Alcohol use: No    Frequency: Never  . Drug use: No  . Sexual activity: Not on file  Lifestyle  . Physical activity:    Days per week: Not on file    Minutes per session: Not on file  . Stress: Not on file  Relationships  . Social connections:    Talks on phone: Not on file    Gets together: Not on file    Attends religious service: Not on file    Active member of club or organization: Not on file    Attends meetings of clubs or organizations: Not on file    Relationship status: Not on file  . Intimate partner violence:    Fear of current or ex partner: Not on file    Emotionally abused: Not on file    Physically abused: Not on file    Forced sexual activity: Not on file  Other Topics Concern  . Not on file  Social History Narrative  . Not on file    Allergies: No Known Allergies  Medications:  Current Outpatient Medications  Medication Sig Dispense Refill  . ferrous sulfate 325 (65 FE) MG EC tablet Take 1 tablet (325 mg total) by mouth daily with breakfast. 30 tablet 0  . lisinopril-hydrochlorothiazide (ZESTORETIC) 20-12.5  MG tablet Take 1 tablet by mouth daily. 30 tablet 3  . methocarbamol (ROBAXIN) 500 MG tablet Take 1 tablet (500 mg total) by mouth 2 (two) times daily. 20 tablet 0  . potassium chloride SA (K-DUR,KLOR-CON) 20 MEQ tablet Take 1 tablet (20 mEq total) by mouth daily. (Patient not taking: Reported on 09/19/2017) 15 tablet 0  No current facility-administered medications for this visit.     Review of Systems: Review of Systems  Gastrointestinal: Positive for blood in stool.  Genitourinary: Positive for menstrual problem.   All other systems reviewed and are negative.    PHYSICAL EXAMINATION Blood pressure (!) 131/102, pulse 99, temperature 98.7 F (37.1 C), temperature source Oral, resp. rate 18, height '5\' 8"'  (1.727 m), weight 231 lb (104.8 kg), SpO2 99 %.  ECOG PERFORMANCE STATUS: 0 - Asymptomatic  Physical Exam  Constitutional: She is oriented to person, place, and time. She appears well-developed and well-nourished. No distress.  HENT:  Head: Normocephalic and atraumatic.  Mouth/Throat: Oropharynx is clear and moist. No oropharyngeal exudate.  Eyes: Pupils are equal, round, and reactive to light. Conjunctivae and EOM are normal. No scleral icterus.  Neck: No thyromegaly present.  Cardiovascular: Normal rate, regular rhythm and intact distal pulses. Exam reveals no gallop and no friction rub.  No murmur heard. Pulmonary/Chest: Effort normal and breath sounds normal. No stridor. No respiratory distress. She has no wheezes. She has no rales.  Abdominal: Soft. Bowel sounds are normal. She exhibits no distension and no mass. There is no tenderness. There is no guarding.  Musculoskeletal: She exhibits no edema.  Lymphadenopathy:    She has no cervical adenopathy.  Neurological: She is alert and oriented to person, place, and time. She displays normal reflexes. No cranial nerve deficit or sensory deficit.  Skin: Skin is warm and dry. No rash noted. She is not diaphoretic. No erythema. No  pallor.     LABORATORY DATA: I have personally reviewed the data as listed: Appointment on 12/01/2017  Component Date Value Ref Range Status  . C3 Complement 12/01/2017 146  82 - 167 mg/dL Final  . Complement C4, Body Fluid 12/01/2017 34  14 - 44 mg/dL Final   Comment: (NOTE) Performed At: [facility] [place], [place] [id number] Rush Farmer MD [id number] Performed at [facility], [place] [address] [place], [place] [address]   . Rhuematoid fact SerPl-aCnc 12/01/2017 <10.0  0.0 - 13.9 IU/mL Final   Comment: (NOTE) Performed At: [facility] [place], [place] [id number] Rush Farmer MD [id number] Performed at [facility], [place] [address], [place], [place] [address]   . ANA Ab, IFA 12/01/2017 Negative   Final   Comment: (NOTE)                                     Negative   <1:80                                     Borderline  1:80                                     Positive   >1:80 Performed At: [facility] [place], [place] [id number] Rush Farmer MD [id number] Performed at [facility], [place] [address], [place], [place] [address]   . CRP 12/01/2017 <0.8  <1.0 mg/dL Final   Performed at [place] [address] [place], [place] [address]  . Sed Rate 12/01/2017 32* 0 - 22 mm/hr Final   Performed at [facility],  [place] [address] [place], [place] [address]  . Zinc 12/01/2017 84  56 - 134 ug/dL Final   Comment: (NOTE) This test was developed and its performance characteristics determined by [facility]. It has not been cleared or approved by the Food and Drug Administration.                                Detection Limit = 5 Performed At: [facility] [place], [place] [id number] Rush Farmer MD [id number] Performed at [facility], [place]  [address], [place], [place] [address]   . Ceruloplasmin 12/01/2017 28.4  19.0 - 39.0 mg/dL Final   Comment: (NOTE) Performed At: [facility] [place], [place] [id number] Rush Farmer MD [id number] Performed at [facility], [place] [address], [place], [place] [address]   . Copper 12/01/2017 121  72 - 166 ug/dL Final   Comment: (NOTE) This test was developed and its performance characteristics determined by [facility]. It has not been cleared or approved by the Food and Drug Administration.                                Detection Limit = 5 Performed At: [facility] [place], [place] [id number] Rush Farmer MD [id number] Performed at [facility], [place] [address] [place], [place] [address]   . Ferritin 12/01/2017 85  9 - 269 ng/mL Final   Performed at [facility], [place] [address] [place], [place] [address]  . Iron 12/01/2017 20* 41 - 142 ug/dL Final  . TIBC 12/01/2017 288  236 - 444 ug/dL Final  . Saturation Ratios 12/01/2017 7* 21 - 57 % Final  . UIBC 12/01/2017 268  ug/dL Final   Performed at [facility], [place] [address], [place], [place] [address]  . Sodium 12/01/2017 142  136 - 145 mmol/L Final  . Potassium 12/01/2017 3.6  3.5 - 5.1 mmol/L Final  . Chloride 12/01/2017 109  98 - 109 mmol/L Final  . CO2 12/01/2017 26  22 - 29 mmol/L Final  . Glucose, Bld 12/01/2017 106  70 - 140 mg/dL Final  . BUN 12/01/2017 6* 7 - 26 mg/dL Final  . Creatinine 12/01/2017 0.70  0.60 - 1.10 mg/dL Final  . Calcium 12/01/2017 9.6  8.4 - 10.4 mg/dL Final  . Total Protein 12/01/2017 7.7  6.4 - 8.3 g/dL Final  . Albumin 12/01/2017 3.8  3.5 - 5.0 g/dL Final  . AST 12/01/2017 14  5 - 34 U/L Final  . ALT 12/01/2017 22  0 - 55 U/L Final  . Alkaline Phosphatase 12/01/2017 87  40 - 150 U/L Final  . Total Bilirubin 12/01/2017 <0.2* 0.2 - 1.2 mg/dL Final  .  GFR, Est Non Af Am 12/01/2017 >60  >60 mL/min Final  . GFR, Est AFR Am 12/01/2017 >60  >60 mL/min Final   Comment: (NOTE) The eGFR has been calculated using the CKD EPI equation. This calculation has not been validated in all clinical situations. eGFR's persistently <60 mL/min signify possible Chronic Kidney Disease.   Georgiann Hahn gap 12/01/2017 7  3 - 11 Final   Performed at [facility], [place] [address] [place], [place] [address]  . WBC Count 12/01/2017 5.1  3.9 - 10.3 K/uL Final  .  RBC 12/01/2017 4.64  3.70 - 5.45 MIL/uL Final  . Hemoglobin 12/01/2017 10.4* 11.6 - 15.9 g/dL Final  . HCT 12/01/2017 32.9* 34.8 - 46.6 % Final  . MCV 12/01/2017 70.9* 79.5 - 101.0 fL Final  . MCH 12/01/2017 22.4* 25.1 - 34.0 pg Final  . MCHC 12/01/2017 31.7  31.5 - 36.0 g/dL Final  . RDW 12/01/2017 16.6* 11.2 - 14.5 % Final  . Platelet Count 12/01/2017 388  145 - 400 K/uL Final  . Neutrophils Relative % 12/01/2017 50  % Final  . Neutro Abs 12/01/2017 2.5  1.5 - 6.5 K/uL Final  . Lymphocytes Relative 12/01/2017 41  % Final  . Lymphs Abs 12/01/2017 2.1  0.9 - 3.3 K/uL Final  . Monocytes Relative 12/01/2017 5  % Final  . Monocytes Absolute 12/01/2017 0.2  0.1 - 0.9 K/uL Final  . Eosinophils Relative 12/01/2017 3  % Final  . Eosinophils Absolute 12/01/2017 0.2  0.0 - 0.5 K/uL Final  . Basophils Relative 12/01/2017 1  % Final  . Basophils Absolute 12/01/2017 0.1  0.0 - 0.1 K/uL Final   Performed at [facility], [place] [address], [place], [place] [address]         Ardath Sax, MD

## 2017-12-12 ENCOUNTER — Inpatient Hospital Stay: Payer: Commercial Managed Care - PPO

## 2017-12-12 ENCOUNTER — Ambulatory Visit: Payer: Commercial Managed Care - PPO | Admitting: Hematology and Oncology

## 2017-12-12 LAB — SOLUBLE TRANSFERRIN RECEPTOR: Transferrin Receptor: 28.4 nmol/L — ABNORMAL HIGH (ref 12.2–27.3)

## 2017-12-15 LAB — HEMOGLOBINOPATHY EVALUATION
HGB A: 98 % (ref 96.4–98.8)
HGB VARIANT: 0 %
Hgb A2 Quant: 2 % (ref 1.8–3.2)
Hgb C: 0 %
Hgb F Quant: 0 % (ref 0.0–2.0)
Hgb S Quant: 0 %

## 2017-12-15 NOTE — Assessment & Plan Note (Signed)
42 y.o. female with long-standing microcytosis and hypochromia in the setting of mild anemia.  Repeat assessment showed normal levels of ferritin with low levels of iron iron saturation and persistent mild microcytic hypochromic anemia.  No deficiency and zinc, copper is noted.  There was elevation of erythrocyte sedimentation rate, but no significant elevation of C-reactive protein.  In this situation, we are dealing with possible mild iron deficiency coupled with possible anemia of chronic disease and a possible hemoglobinopathy.  Plan: - Start ferrous sulfate 325 mg p.o. daily -Additional lab work today including hemoglobin electrophoresis, transferrin, and soluble transferrin receptor to differentiate anemia of iron deficiency versus anemia of chronic disease and hemoglobinopathies -Referral for gastroenterology for possible EGD and colonoscopy due to hematochezia. - Return to clinic in 1 month with additional lab work for continued hematological monitoring

## 2017-12-15 NOTE — Progress Notes (Signed)
[facility] Follow-up Visit:  Assessment: Microcytic anemia 42 y.o. female with long-standing microcytosis and hypochromia in the setting of mild anemia.  Repeat assessment showed normal levels of ferritin with low levels of iron iron saturation and persistent mild microcytic hypochromic anemia.  No deficiency and zinc, copper is noted.  There was elevation of erythrocyte sedimentation rate, but no significant elevation of C-reactive protein.  In this situation, we are dealing with possible mild iron deficiency coupled with possible anemia of chronic disease and a possible hemoglobinopathy.  Plan: - Start ferrous sulfate 325 mg p.o. daily -Additional lab work today including hemoglobin electrophoresis, transferrin, and soluble transferrin receptor to differentiate anemia of iron deficiency versus anemia of chronic disease and hemoglobinopathies -Referral for gastroenterology for possible EGD and colonoscopy due to hematochezia. - Return to clinic in 1 month with additional lab work for continued hematological monitoring   Voice recognition software was used and creation of this note. Despite my best effort at editing the text, some misspelling/errors may have occurred.  Orders Placed This Encounter  Procedures  . Hemoglobinopathy evaluation  . Transferrin  . Soluble transferrin receptor  . CBC with Differential (Cancer Center Only)    Standing Status:   Future    Standing Expiration Date:   12/12/2018  . CMP ([facility] only)    Standing Status:   Future    Standing Expiration Date:   12/12/2018  . Iron and TIBC    Standing Status:   Future    Standing Expiration Date:   12/12/2018  . Ferritin    Standing Status:   Future    Standing Expiration Date:   12/12/2018    Cancer Staging No matching staging information was found for the patient.  All questions were answered.  . The patient knows to call the clinic with any problems, questions or concerns.  This  note was electronically signed.    History of Presenting Illness Alexis Haynes is a 42 y.o. female followed in the [facility] for microcytic hypochromic anemia, referred by Dr Gardiner Coins.  Patient's past medical history significant for hypertension.  She does have a long-term history of microcytosis with mild anemia.  Previously has been treated with IV iron without significant response at the time.  Suspicion was made for possible presence of thalassemia and genetic testing for both alpha and beta thalassemia was obtained by [facility] system.  Results of the testing are not accessible through [facility].  Appears that patient was referred to [place] for continued hematological monitoring with the most recent hematological profile available dating back to May or June 2018.  Patient overall reports being at the baseline state of health without any new significant symptoms.  Patient does report occasional blood in stool and had a colonoscopy most recently about 2 years ago.  The hematochezia was attributed to hemorrhoids.  Patient has regular menstrual periods occurring on a monthly basis with the last one starting 3 days ago.  Usually periods last for 4 days with the current periods being heavier than average and associated with some abdominal cramping.   Oncological/hematological History: --Labs, 11/20/16: WBC 4.4, Hgb 10.6, MCV 71.1, MCH 22.6, MCHC 31.7, RDW 15.3, Plt 331 --Labs, 12/01/17: WBC 5.1, Hgb 10.4, MCV 70.9, MCH 22.4, MCHC 31.7, RDW 16.6, Plt 388; Fe 20, FeSat 7%, TIBC 288, Ferritin 85, Cu 121, Zn 84, Ceruloplasmin 28.4; ESR 35, CRP <0.8   No history exists.    Medical History: Past  Medical History:  Diagnosis Date  . Hypertension     Surgical History: Past Surgical History:  Procedure Laterality Date  . CHOLECYSTECTOMY    . TUBAL LIGATION      Family History: Family History  Problem Relation Age of Onset  . Hypertension Paternal  Grandmother   . Hypertension Mother     Social History: Social History   Socioeconomic History  . Marital status: Married    Spouse name: Not on file  . Number of children: Not on file  . Years of education: Not on file  . Highest education level: Not on file  Occupational History  . Not on file  Social Needs  . Financial resource strain: Not on file  . Food insecurity:    Worry: Not on file    Inability: Not on file  . Transportation needs:    Medical: Not on file    Non-medical: Not on file  Tobacco Use  . Smoking status: Never Smoker  . Smokeless tobacco: Never Used  Substance and Sexual Activity  . Alcohol use: No    Frequency: Never  . Drug use: No  . Sexual activity: Not on file  Lifestyle  . Physical activity:    Days per week: Not on file    Minutes per session: Not on file  . Stress: Not on file  Relationships  . Social connections:    Talks on phone: Not on file    Gets together: Not on file    Attends religious service: Not on file    Active member of club or organization: Not on file    Attends meetings of clubs or organizations: Not on file    Relationship status: Not on file  . Intimate partner violence:    Fear of current or ex partner: Not on file    Emotionally abused: Not on file    Physically abused: Not on file    Forced sexual activity: Not on file  Other Topics Concern  . Not on file  Social History Narrative  . Not on file    Allergies: No Known Allergies  Medications:  Current Outpatient Medications  Medication Sig Dispense Refill  . ferrous sulfate 325 (65 FE) MG EC tablet Take 1 tablet (325 mg total) by mouth daily with breakfast. 30 tablet 0  . lisinopril-hydrochlorothiazide (ZESTORETIC) 20-12.5 MG tablet Take 1 tablet by mouth daily. 30 tablet 3  . methocarbamol (ROBAXIN) 500 MG tablet Take 1 tablet (500 mg total) by mouth 2 (two) times daily. 20 tablet 0  . potassium chloride SA (K-DUR,KLOR-CON) 20 MEQ tablet Take 1 tablet  (20 mEq total) by mouth daily. (Patient not taking: Reported on 09/19/2017) 15 tablet 0   No current facility-administered medications for this visit.     Review of Systems: Review of Systems  Gastrointestinal: Positive for blood in stool.  Genitourinary: Positive for menstrual problem.   All other systems reviewed and are negative.    PHYSICAL EXAMINATION Blood pressure (!) 145/109, pulse 100, temperature 98.8 F (37.1 C), temperature source Oral, resp. rate 20, height '5\' 8"'  (1.727 m), weight 237 lb 6.4 oz (107.7 kg), SpO2 99 %.  ECOG PERFORMANCE STATUS: 1 - Symptomatic but completely ambulatory  Physical Exam  Constitutional: She is oriented to person, place, and time. She appears well-developed and well-nourished. No distress.  HENT:  Head: Normocephalic and atraumatic.  Mouth/Throat: Oropharynx is clear and moist. No oropharyngeal exudate.  Eyes: Pupils are equal, round, and reactive to light. Conjunctivae  and EOM are normal. No scleral icterus.  Neck: No thyromegaly present.  Cardiovascular: Normal rate, regular rhythm and intact distal pulses. Exam reveals no gallop and no friction rub.  No murmur heard. Pulmonary/Chest: Effort normal and breath sounds normal. No stridor. No respiratory distress. She has no wheezes. She has no rales.  Abdominal: Soft. Bowel sounds are normal. She exhibits no distension and no mass. There is no tenderness. There is no guarding.  Musculoskeletal: She exhibits no edema.  Lymphadenopathy:    She has no cervical adenopathy.  Neurological: She is alert and oriented to person, place, and time. She displays normal reflexes. No cranial nerve deficit or sensory deficit.  Skin: Skin is warm and dry. No rash noted. She is not diaphoretic. No erythema. No pallor.     LABORATORY DATA: I have personally reviewed the data as listed: Office Visit on 12/11/2017  Component Date Value Ref Range Status  . Hgb A2 Quant 12/11/2017 2.0  1.8 - 3.2 % Final  . Hgb  F Quant 12/11/2017 0.0  0.0 - 2.0 % Final  . Hgb S Quant 12/11/2017 0.0  0.0 % Final  . Hgb C 12/11/2017 0.0  0.0 % Corrected  . Hgb A 12/11/2017 98.0  96.4 - 98.8 % Final  . Hgb Variant 12/11/2017 0.0  0.0 % Corrected  . Please Note: 12/11/2017 Comment   Corrected   Comment: (NOTE) Normal adult hemoglobin present. Performed At: [facility] [place], [place] [id number] Rush Farmer MD [id number] Performed at [facility], [place] [address] [place], [place] [address]   . Transferrin 12/11/2017 230  192 - 382 mg/dL Final   Performed at [facility], [place] [address], [place], [place] [address]  . Transferrin Receptor 12/11/2017 28.4* 12.2 - 27.3 nmol/L Final   Comment: (NOTE) Performed At: [facility] [place], [place] [id number] Rush Farmer MD [id number] Performed at [facility], [place] [address], [place], [place] [address]        Ardath Sax, MD

## 2018-01-06 ENCOUNTER — Inpatient Hospital Stay: Payer: Commercial Managed Care - PPO | Attending: Hematology and Oncology

## 2018-01-08 ENCOUNTER — Other Ambulatory Visit: Payer: Commercial Managed Care - PPO

## 2018-01-08 ENCOUNTER — Inpatient Hospital Stay: Payer: Commercial Managed Care - PPO | Admitting: Hematology

## 2018-07-27 ENCOUNTER — Other Ambulatory Visit: Payer: Self-pay | Admitting: Physician Assistant

## 2018-07-27 DIAGNOSIS — I10 Essential (primary) hypertension: Secondary | ICD-10-CM

## 2018-07-27 NOTE — Telephone Encounter (Signed)
Requested medication (s) are due for refill today: Yes  Requested medication (s) are on the active medication list: Yes  Last refill:  09/19/17  Future visit scheduled: No  Notes to clinic:  Unable to refill per protocol, failed items, appointment needed.     Requested Prescriptions  Pending Prescriptions Disp Refills   lisinopril-hydrochlorothiazide (PRINZIDE,ZESTORETIC) 20-12.5 MG tablet [Pharmacy Med Name: LISINOPRIL-HCTZ 20/12.5MG  TABLETS] 30 tablet 3    Sig: TAKE 1 TABLET BY MOUTH EVERY DAY     Cardiovascular:  ACEI + Diuretic Combos Failed - 07/27/2018  3:08 PM      Failed - Na in normal range and within 180 days    Sodium  Date Value Ref Range Status  12/01/2017 142 136 - 145 mmol/L Final  09/19/2017 139 134 - 144 mmol/L Final         Failed - K in normal range and within 180 days    Potassium  Date Value Ref Range Status  12/01/2017 3.6 3.5 - 5.1 mmol/L Final         Failed - Cr in normal range and within 180 days    Creatinine  Date Value Ref Range Status  12/01/2017 0.70 0.60 - 1.10 mg/dL Final         Failed - Ca in normal range and within 180 days    Calcium  Date Value Ref Range Status  12/01/2017 9.6 8.4 - 10.4 mg/dL Final         Failed - Last BP in normal range    BP Readings from Last 1 Encounters:  12/11/17 (!) 145/109         Failed - Valid encounter within last 6 months    Recent Outpatient Visits          10 months ago Essential hypertension   Primary Care at [facility], [place], [place]             Passed - Patient is not pregnant

## 2018-07-27 NOTE — Telephone Encounter (Signed)
Patient called, recording says the person called is unavailable, try the call again later. Patient will need an appointment to establish with another provider at [facility].

## 2018-07-29 NOTE — Telephone Encounter (Signed)
Patient is calling to follow up on this. Informed her of the 24-72 business hour turn around time.

## 2018-08-11 ENCOUNTER — Ambulatory Visit: Payer: Commercial Managed Care - PPO | Admitting: Emergency Medicine

## 2018-08-11 ENCOUNTER — Other Ambulatory Visit: Payer: Self-pay | Admitting: Physician Assistant

## 2018-08-11 DIAGNOSIS — I10 Essential (primary) hypertension: Secondary | ICD-10-CM

## 2018-08-11 NOTE — Telephone Encounter (Signed)
Requested medication (s) are due for refill today: no  Requested medication (s) are on the active medication list: yes  Last refill:  07/31/2018 #30  Future visit scheduled: pt scheduled for appointment 08/11/2018 with Dr Alvy Bimler  Notes to clinic: pt scheduled for appointment 08/11/2018    Requested Prescriptions  Pending Prescriptions Disp Refills   lisinopril-hydrochlorothiazide (PRINZIDE,ZESTORETIC) 20-12.5 MG tablet [Pharmacy Med Name: LISINOPRIL-HCTZ 20/12.5MG  TABLETS] 30 tablet 3    Sig: TAKE 1 TABLET BY MOUTH EVERY DAY     Cardiovascular:  ACEI + Diuretic Combos Failed - 08/11/2018 12:05 PM      Failed - Na in normal range and within 180 days    Sodium  Date Value Ref Range Status  12/01/2017 142 136 - 145 mmol/L Final  09/19/2017 139 134 - 144 mmol/L Final         Failed - K in normal range and within 180 days    Potassium  Date Value Ref Range Status  12/01/2017 3.6 3.5 - 5.1 mmol/L Final         Failed - Cr in normal range and within 180 days    Creatinine  Date Value Ref Range Status  12/01/2017 0.70 0.60 - 1.10 mg/dL Final         Failed - Ca in normal range and within 180 days    Calcium  Date Value Ref Range Status  12/01/2017 9.6 8.4 - 10.4 mg/dL Final         Failed - Last BP in normal range    BP Readings from Last 1 Encounters:  12/11/17 (!) 145/109         Failed - Valid encounter within last 6 months    Recent Outpatient Visits          10 months ago Essential hypertension   Primary Care at [facility] [place], [place], [place]             Passed - Patient is not pregnant

## 2018-08-13 ENCOUNTER — Emergency Department (HOSPITAL_COMMUNITY): Payer: Commercial Managed Care - PPO

## 2018-08-13 ENCOUNTER — Other Ambulatory Visit: Payer: Self-pay

## 2018-08-13 ENCOUNTER — Encounter (HOSPITAL_COMMUNITY): Payer: Self-pay

## 2018-08-13 ENCOUNTER — Emergency Department (HOSPITAL_COMMUNITY)
Admission: EM | Admit: 2018-08-13 | Discharge: 2018-08-14 | Disposition: A | Discharge status: Home / Self Care | Payer: Commercial Managed Care - PPO | Attending: Emergency Medicine | Admitting: Emergency Medicine

## 2018-08-13 DIAGNOSIS — R000 Tachycardia, unspecified: Secondary | ICD-10-CM | POA: Diagnosis not present

## 2018-08-13 DIAGNOSIS — I10 Essential (primary) hypertension: Secondary | ICD-10-CM | POA: Diagnosis present

## 2018-08-13 DIAGNOSIS — R030 Elevated blood-pressure reading, without diagnosis of hypertension: Secondary | ICD-10-CM

## 2018-08-13 LAB — I-STAT CREATININE, ED: CREATININE: 0.4 mg/dL — AB (ref 0.44–1.00)

## 2018-08-13 LAB — POCT I-STAT EG7
Bicarbonate: 24.4 mmol/L (ref 20.0–28.0)
Calcium, Ion: 1.23 mmol/L (ref 1.15–1.40)
HEMATOCRIT: 32 % — AB (ref 36.0–46.0)
Hemoglobin: 10.9 g/dL — ABNORMAL LOW (ref 12.0–15.0)
O2 Saturation: 95 %
Potassium: 3.4 mmol/L — ABNORMAL LOW (ref 3.5–5.1)
Sodium: 139 mmol/L (ref 135–145)
TCO2: 26 mmol/L (ref 22–32)
pCO2, Ven: 39.2 mmHg — ABNORMAL LOW (ref 44.0–60.0)
pH, Ven: 7.402 (ref 7.250–7.430)
pO2, Ven: 75 mmHg — ABNORMAL HIGH (ref 32.0–45.0)

## 2018-08-13 MED ORDER — SODIUM CHLORIDE 0.9 % IV BOLUS
1000.0000 mL | Freq: Once | INTRAVENOUS | Status: AC
  Administered 2018-08-13: 1000 mL via INTRAVENOUS

## 2018-08-13 MED ORDER — HYDRALAZINE HCL 20 MG/ML IJ SOLN
5.0000 mg | Freq: Once | INTRAMUSCULAR | Status: AC
  Administered 2018-08-13: 5 mg via INTRAVENOUS
  Filled 2018-08-13: qty 1

## 2018-08-13 NOTE — ED Provider Notes (Signed)
[facility]-EMERGENCY DEPT Provider Note   CSN: [id number] Arrival date & time: 08/13/18  1835     History   Chief Complaint Chief Complaint  Patient presents with  . Hypertension    HPI Alexis Haynes is a 43 y.o. female.  The history is provided by the patient and medical records. No language interpreter was used.   Alexis Haynes is a 43 y.o. female  with a PMH of HTN who presents to the Emergency Department by recommendation of her primary care doctor for concerns for elevated blood pressure readings.  She was seen by her primary care doctor 2 days ago who changed her blood pressure medication from lisinopril/hctz 10/12.5 to Norvasc 5 mg.  She followed up today and her blood pressure was still persistently elevated.  She reports that her primary care doctor was concerned about her bottom blood pressure number being over 100. She believes it was in the 110's. She reports having a headache intermittently over the last few days, but no headache currently. No visual changes, abdominal pain, chest pain or shortness of breath. Currently without complaints.  Her primary care doctor recommended that she come to the emergency department to get her blood pressure lowered.  Past Medical History:  Diagnosis Date  . Hypertension     Patient Active Problem List   Diagnosis Date Noted  . Microcytic anemia 12/01/2017  . Hypertension   . Microcytosis 09/02/2014    Past Surgical History:  Procedure Laterality Date  . CHOLECYSTECTOMY    . TUBAL LIGATION       OB History   No obstetric history on file.      Home Medications    Prior to Admission medications   Medication Sig Start Date End Date Taking? Authorizing Provider  amLODipine (NORVASC) 5 MG tablet Take 5 mg by mouth daily.  08/11/18  Yes [provider]  ferrous sulfate 325 (65 FE) MG EC tablet Take 1 tablet (325 mg total) by mouth daily with breakfast. 12/11/17 01/10/18  Daisy BlossomPerlov,  Mikhail G, MD  lisinopril-hydrochlorothiazide (ZESTORETIC) 20-12.5 MG tablet Take 1 tablet by mouth daily. Patient not taking: Reported on 08/13/2018 09/19/17   McVey, Madelaine BhatElizabeth Whitney, PA-C  methocarbamol (ROBAXIN) 500 MG tablet Take 1 tablet (500 mg total) by mouth 2 (two) times daily. Patient not taking: Reported on 08/13/2018 11/03/17   Dietrich PatesKhatri, Hina, PA-C  potassium chloride SA (K-DUR,KLOR-CON) 20 MEQ tablet Take 1 tablet (20 mEq total) by mouth daily. Patient not taking: Reported on 09/19/2017 11/20/16   Abelino DerrickMackuen, Courteney Lyn, MD    Family History Family History  Problem Relation Age of Onset  . Hypertension Paternal Grandmother   . Hypertension Mother     Social History Social History   Tobacco Use  . Smoking status: Never Smoker  . Smokeless tobacco: Never Used  Substance Use Topics  . Alcohol use: No    Frequency: Never  . Drug use: No     Allergies   Patient has no known allergies.   Review of Systems Review of Systems  Neurological: Positive for headaches.  All other systems reviewed and are negative.    Physical Exam Updated Vital Signs BP (!) 154/108   Pulse 98   Temp 98.7 F (37.1 C) (Oral)   Resp (!) 24   Ht 5\' 4"  (1.626 m)   Wt 106.6 kg   LMP 08/13/2018   SpO2 100%   BMI 40.34 kg/m   Physical Exam Vitals signs and nursing note  reviewed.  Constitutional:      General: She is not in acute distress.    Appearance: She is well-developed.  HENT:     Head: Normocephalic and atraumatic.  Neck:     Musculoskeletal: Neck supple.  Cardiovascular:     Rate and Rhythm: Normal rate and regular rhythm.     Heart sounds: Normal heart sounds. No murmur.  Pulmonary:     Effort: Pulmonary effort is normal. No respiratory distress.     Breath sounds: Normal breath sounds.  Abdominal:     General: There is no distension.     Palpations: Abdomen is soft.     Tenderness: There is no abdominal tenderness.  Skin:    General: Skin is warm and dry.    Neurological:     Mental Status: She is alert and oriented to person, place, and time.     Comments: Alert, oriented, thought content appropriate, able to give a coherent history. Speech is clear and goal oriented, able to follow commands.  Cranial Nerves:  II:  Peripheral visual fields grossly normal, pupils equal, round, reactive to light III, IV, VI: EOM intact bilaterally, ptosis not present V,VII: smile symmetric, eyes kept closed tightly against resistance, facial light touch sensation equal VIII: hearing grossly normal IX, X: symmetric soft palate movement, uvula elevates symmetrically  XI: bilateral shoulder shrug symmetric and strong XII: midline tongue extension 5/5 muscle strength in upper and lower extremities bilaterally including strong and equal grip strength and dorsiflexion/plantar flexion Sensory to light touch normal in all four extremities.  Normal finger-to-nose and rapid alternating movements; normal gait and balance. Negative romberg, no pronator drift.      ED Treatments / Results  Labs (all labs ordered are listed, but only abnormal results are displayed) Labs Reviewed  I-STAT CREATININE, ED - Abnormal; Notable for the following components:      Result Value   Creatinine, Ser 0.40 (*)    All other components within normal limits  POCT I-STAT EG7 - Abnormal; Notable for the following components:   pCO2, Ven 39.2 (*)    pO2, Ven 75.0 (*)    Potassium 3.4 (*)    HCT 32.0 (*)    Hemoglobin 10.9 (*)    All other components within normal limits  TSH  CBG MONITORING, ED    EKG EKG Interpretation  Date/Time:  Thursday August 13 2018 23:38:24 EST Ventricular Rate:  96 PR Interval:    QRS Duration: 96 QT Interval:  402 QTC Calculation: 508 R Axis:   62 Text Interpretation:  Sinus rhythm Borderline prolonged QT interval Confirmed by Tilden Fossaees, Elizabeth [phone]([id number]) on 08/13/2018 11:48:12 PM   Radiology Dg Chest 2 View  Result Date: 08/13/2018 CLINICAL DATA:   Malaise. Elevated blood pressure 2 days ago. EXAM: CHEST - 2 VIEW COMPARISON:  None. FINDINGS: Slightly low lung volumes. No acute pulmonary consolidation, edema or pneumothorax. Heart size is top-normal with minimal atherosclerosis at the aortic arch. No aneurysm is noted. No acute nor suspicious osseous lesions. IMPRESSION: No active cardiopulmonary disease. Electronically Signed   By: Tollie Ethavid  Kwon M.D.   On: 08/13/2018 23:28    Procedures Procedures (including critical care time)  Medications Ordered in ED Medications  hydrALAZINE (APRESOLINE) injection 5 mg (5 mg Intravenous Given 08/13/18 2203)  sodium chloride 0.9 % bolus 1,000 mL (1,000 mLs Intravenous New Bag/Given 08/13/18 2236)     Initial Impression / Assessment and Plan / ED Course  I have reviewed the triage vital signs and  the nursing notes.  Pertinent labs & imaging results that were available during my care of the patient were reviewed by me and considered in my medical decision making (see chart for details).    DEAN WONDER is a 43 y.o. female who presents to ED for elevated blood pressure. Patient is currently asymptomatic. Labs reassuring. No chest pain, diaphoresis, nausea or other ACS symptoms. Reports headache intermittently, but not currently. No other neurologic complaints. No change in urine output. No sxs to suggest end organ damage. TSH ordered. Patient wanting to go home. Do not feel results will change management tonight as she will need PCP follow up regardless of results. Informed patient that I will call her in the morning if results are abnormal. Evaluation does not show pathology that would require ongoing emergent intervention or inpatient treatment. Patient understands importance of close PCP follow up for further BP management. Reasons to return to ER were discussed and all questions answered.    Patient seen by and discussed with Dr. Madilyn Hook who agrees with treatment plan.    Final Clinical  Impressions(s) / ED Diagnoses   Final diagnoses:  Tachycardia  Elevated blood pressure reading    ED Discharge Orders    None       Carvin Almas, Chase Picket, PA-C 08/14/18 0008    Tilden Fossa, MD 08/17/18 [phone]

## 2018-08-13 NOTE — Discharge Instructions (Signed)
Please call your primary care doctor in the morning to schedule a follow up appointment.   If your thyroid lab is abnormal, I will give you a call tomorrow morning.   Return to ER for new or worsening symptoms, any additional concerns.

## 2018-08-13 NOTE — ED Triage Notes (Signed)
Patient reports that she saw her PCP 2 days ago and then again today because she "just did not feel good." Patient states her BP was elevated 2 days ago and her PCP changed her BP med.  BP in triage-150/117.

## 2018-08-14 LAB — TSH: TSH: 1.339 u[IU]/mL (ref 0.350–4.500)

## 2018-11-17 IMAGING — CT CT HEAD W/O CM
4 series · 16 of 47 positions shown, 18 images · non-contrast
Comparison: None

CLINICAL DATA: Headache, lightheadedness worse when going from
sitting to standing beginning today, decreased fluid intake

EXAM:
CT HEAD WITHOUT CONTRAST
TECHNIQUE: Contiguous axial images were obtained from the base of the skull
through the vertex without intravenous contrast. Sagittal and
coronal MPR images reconstructed from axial data set.

[Series 2: head w/o · axial · non-contrast · 0.43mm/px · z∈[-134,-19]mm · 7 of 31 slices shown, 9 images]
[im 4/31  brain]
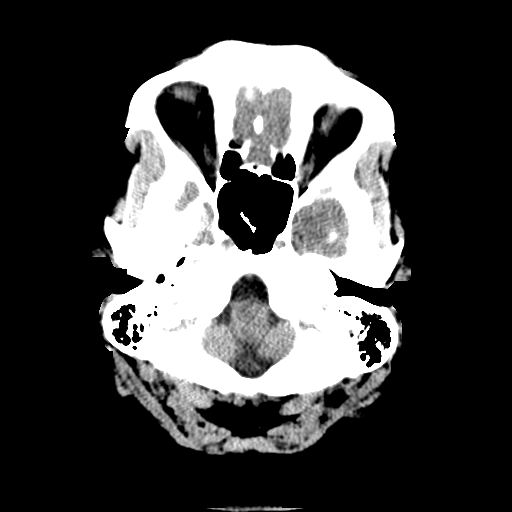
[im 4/31  bone]
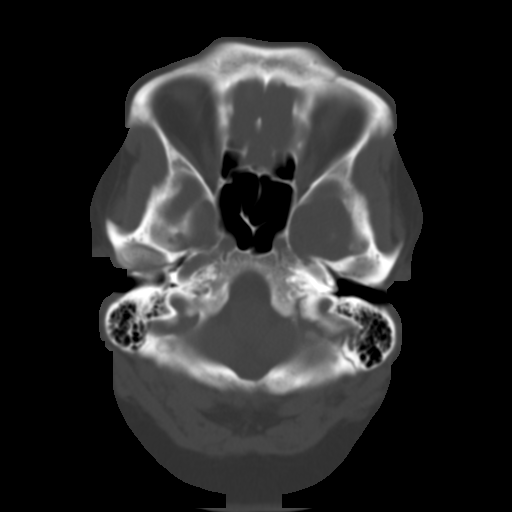
[im 8/31  brain]
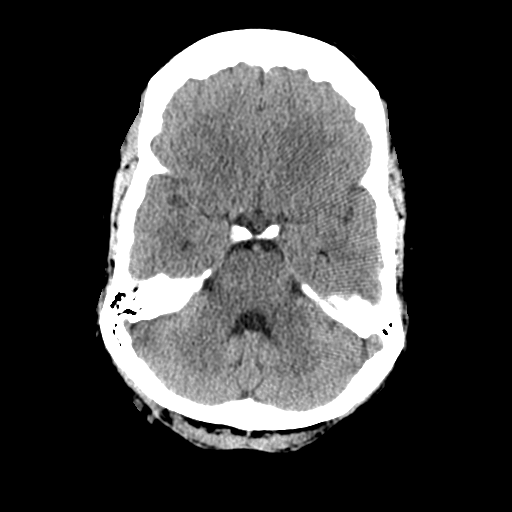
[im 12/31  brain]
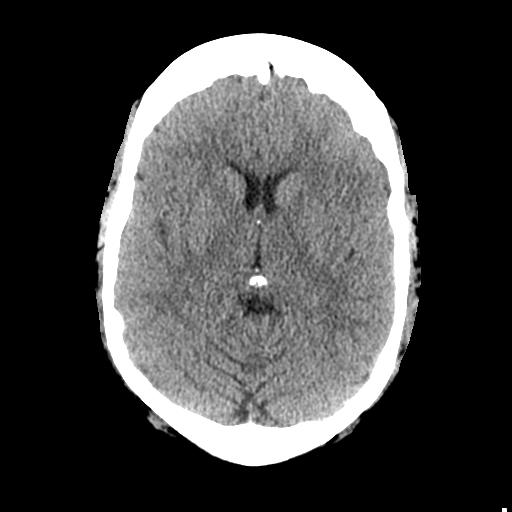
[im 16/31  brain]
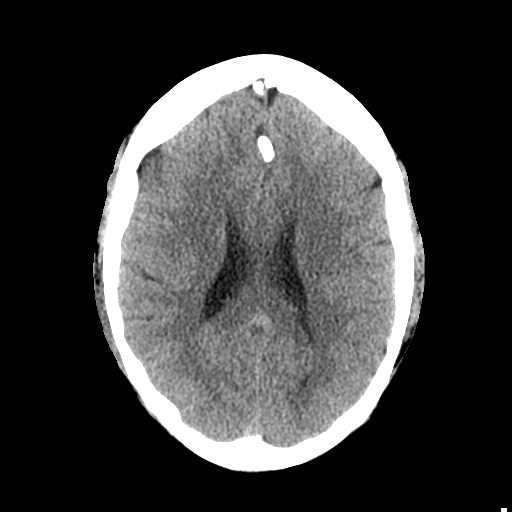
[im 19/31  brain]
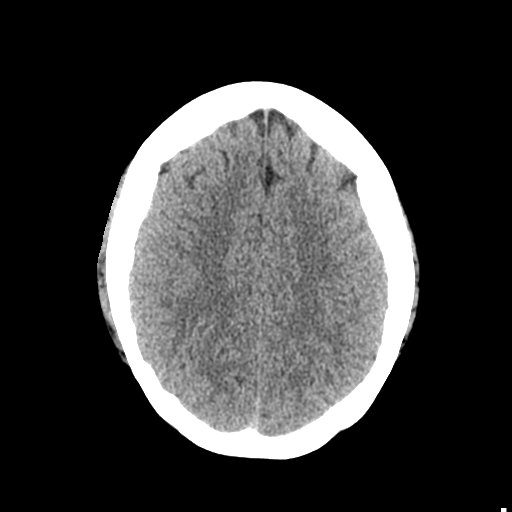
[im 19/31  bone]
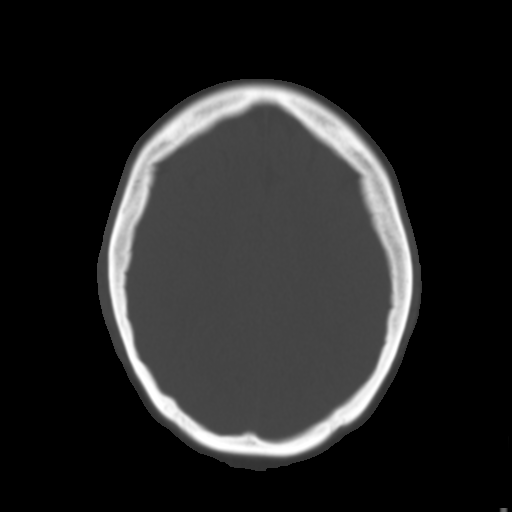
[im 23/31  brain]
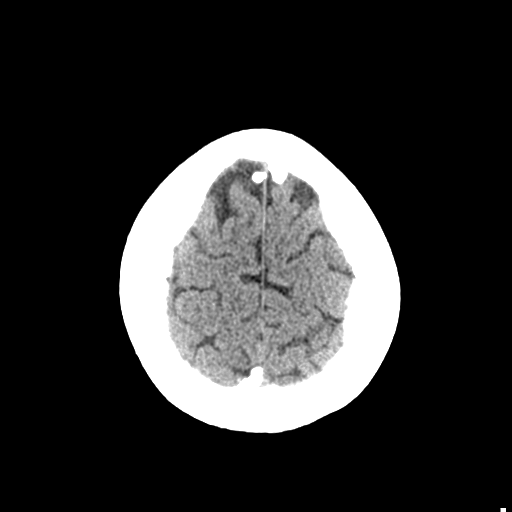
[im 27/31  brain]
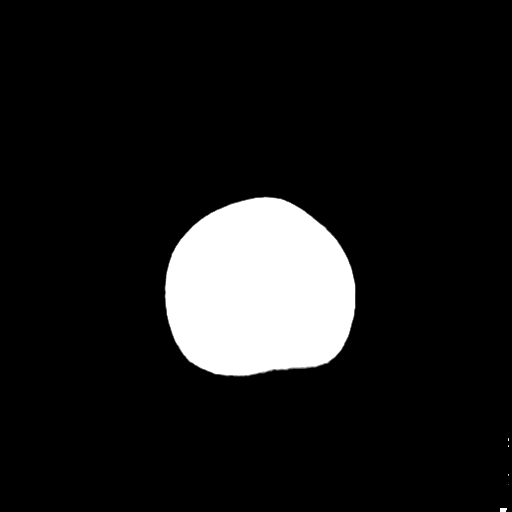

[Series 3: bone windows · axial · 0.43mm/px · z∈[-135,-105]mm · 3 of 76 slices shown]
[im 8/76  bone]
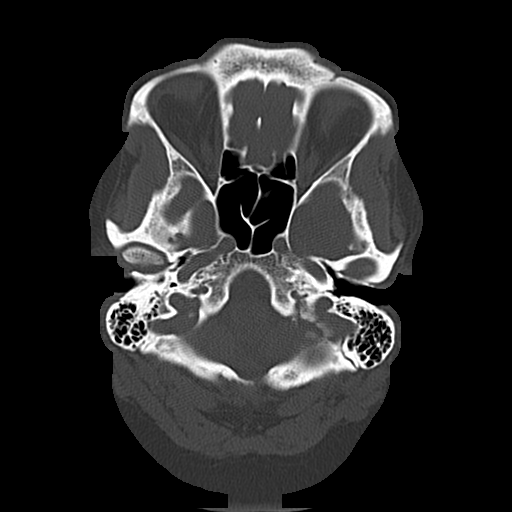
[im 16/76  bone]
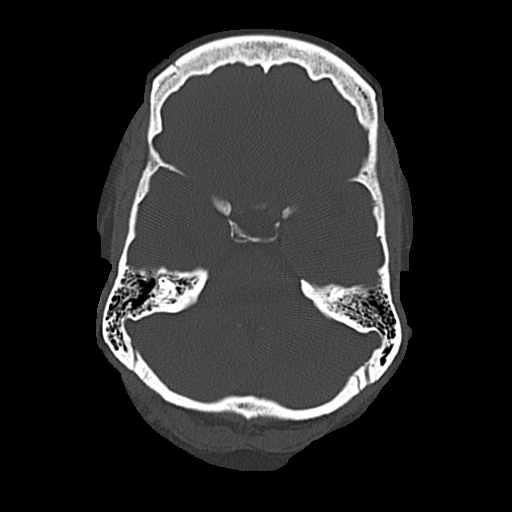
[im 23/76  bone]
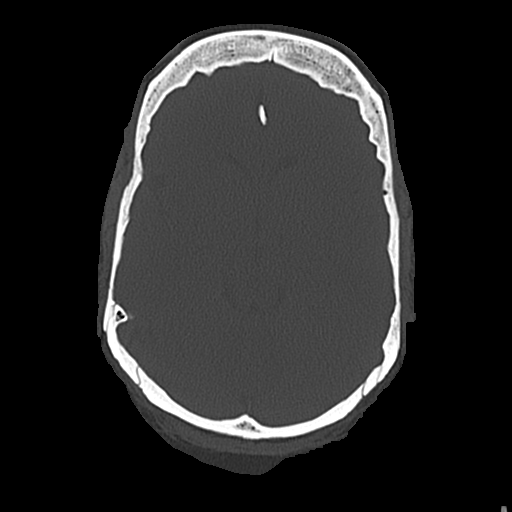

[Series 5: coronal · coronal · 0.31mm/px · 3 of 72 slices shown]
[im 24/72  brain]
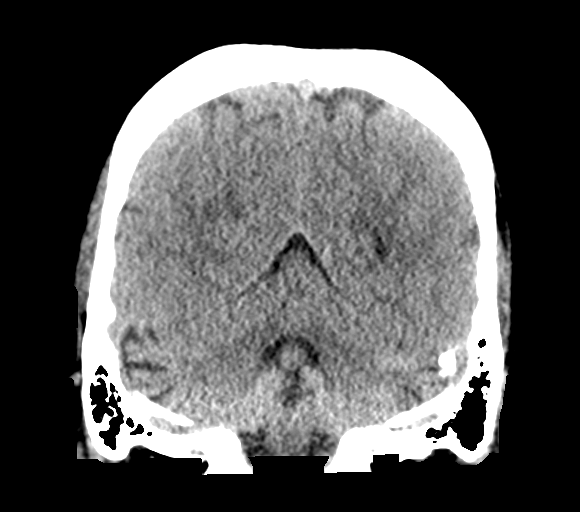
[im 32/72  brain]
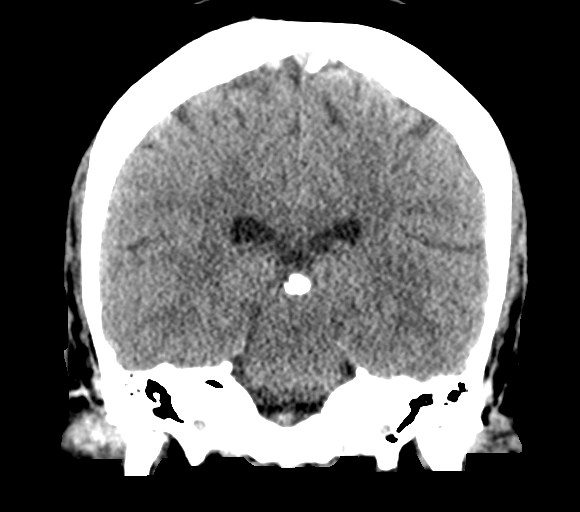
[im 40/72  brain]
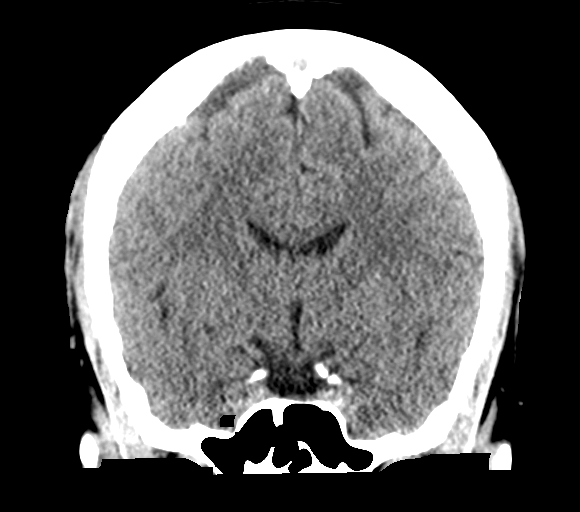

[Series 6: sagittal · sagittal · 0.31mm/px · 3 of 52 slices shown]
[im 18/52  brain]
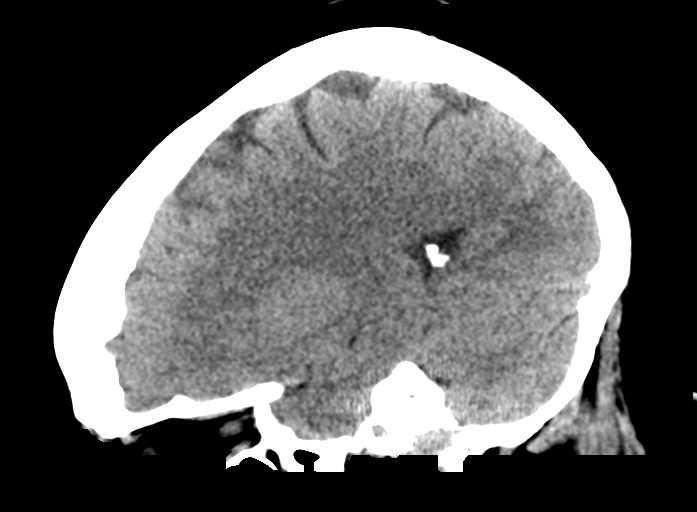
[im 26/52  brain]
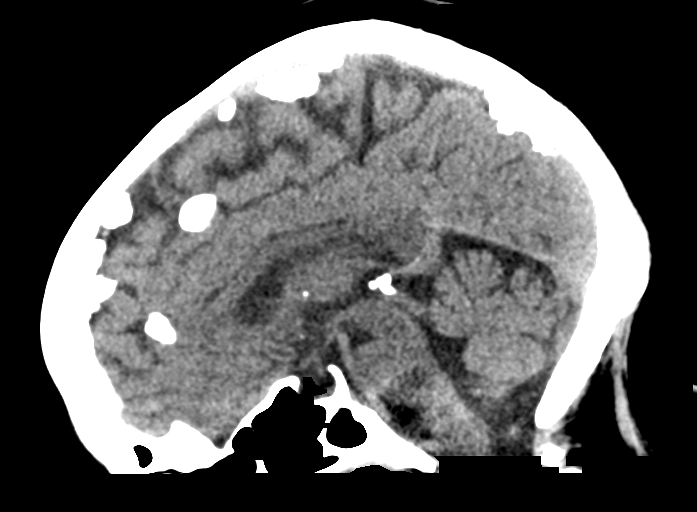
[im 35/52  brain]
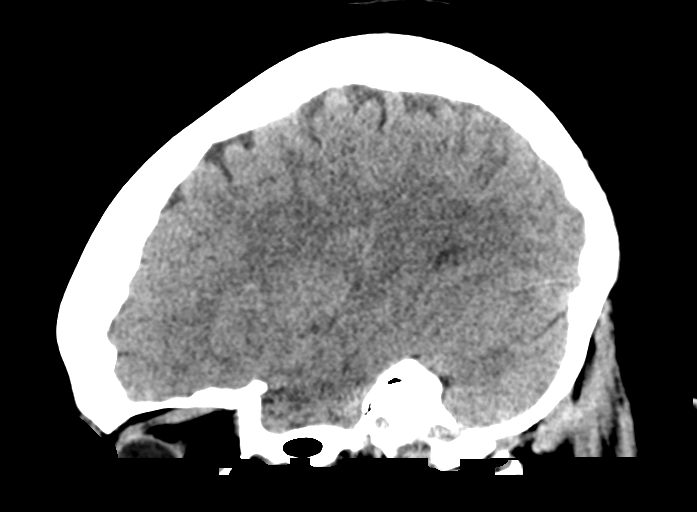

[16 of 47 positions shown; findings below may reference images not displayed]

FINDINGS: Brain: Normal ventricular morphology. No midline shift or mass
effect. Normal appearance of brain parenchyma. No intracranial
hemorrhage, mass lesion or evidence acute infarction. No extra-axial
fluid collections.

Vascular: Unremarkable

Skull: Intact.  Few scattered nonspecific dural calcifications.

Sinuses/Orbits: Clear

Other: N/A
IMPRESSION: No acute intracranial abnormalities.

## 2019-02-23 ENCOUNTER — Other Ambulatory Visit: Payer: Self-pay | Admitting: Physician Assistant

## 2019-02-23 DIAGNOSIS — I10 Essential (primary) hypertension: Secondary | ICD-10-CM

## 2019-08-04 ENCOUNTER — Telehealth: Payer: Self-pay | Admitting: Hematology

## 2019-08-04 NOTE — Telephone Encounter (Signed)
Returned patient's phone call regarding scheduling an appointment, voicemail is not set up.

## 2020-08-09 IMAGING — CR DG CHEST 2V
2 series · 2 of 2 positions shown · non-contrast
Comparison: None.

CLINICAL DATA: Malaise. Elevated blood pressure 2 days ago.

EXAM:
CHEST - 2 VIEW

[w chest pa]
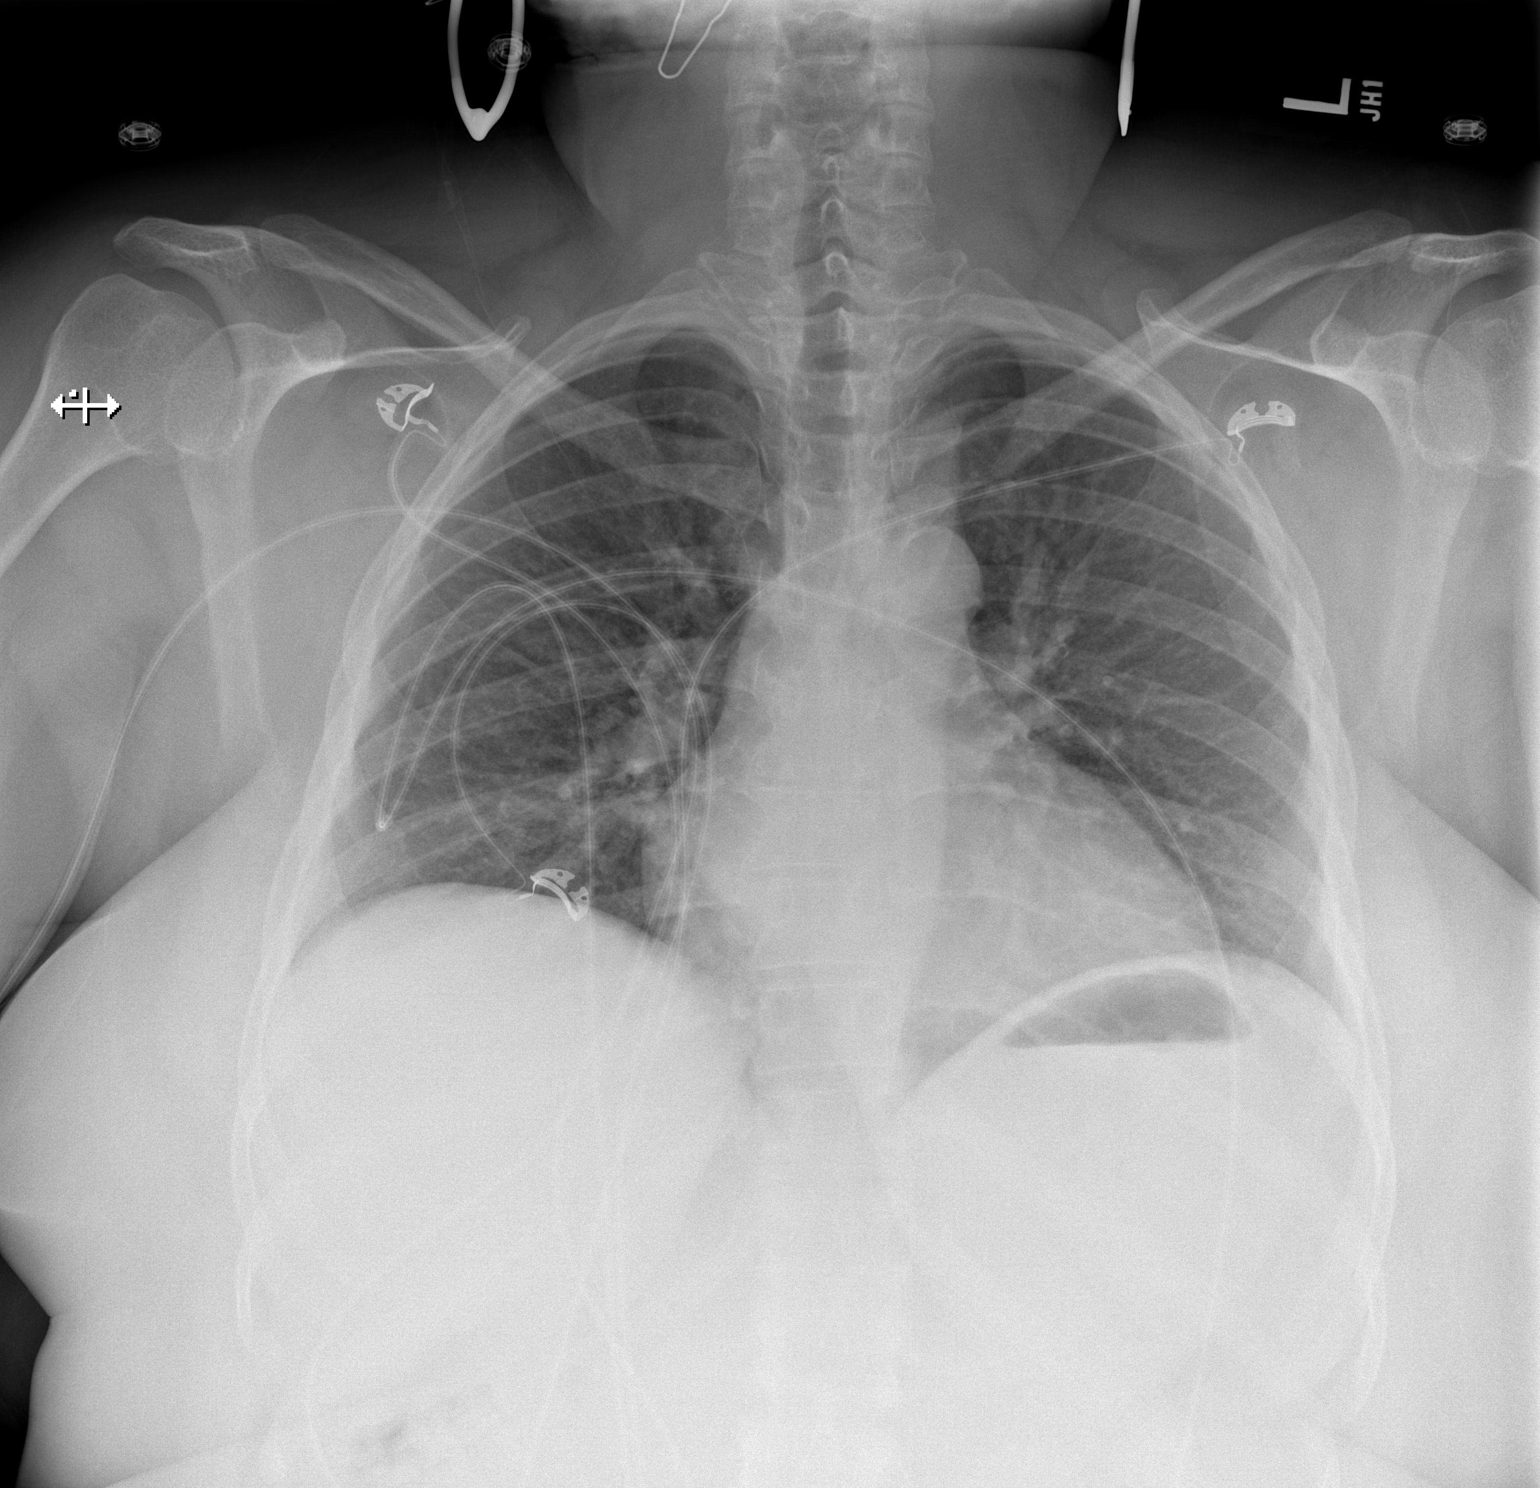

[w chest lat]
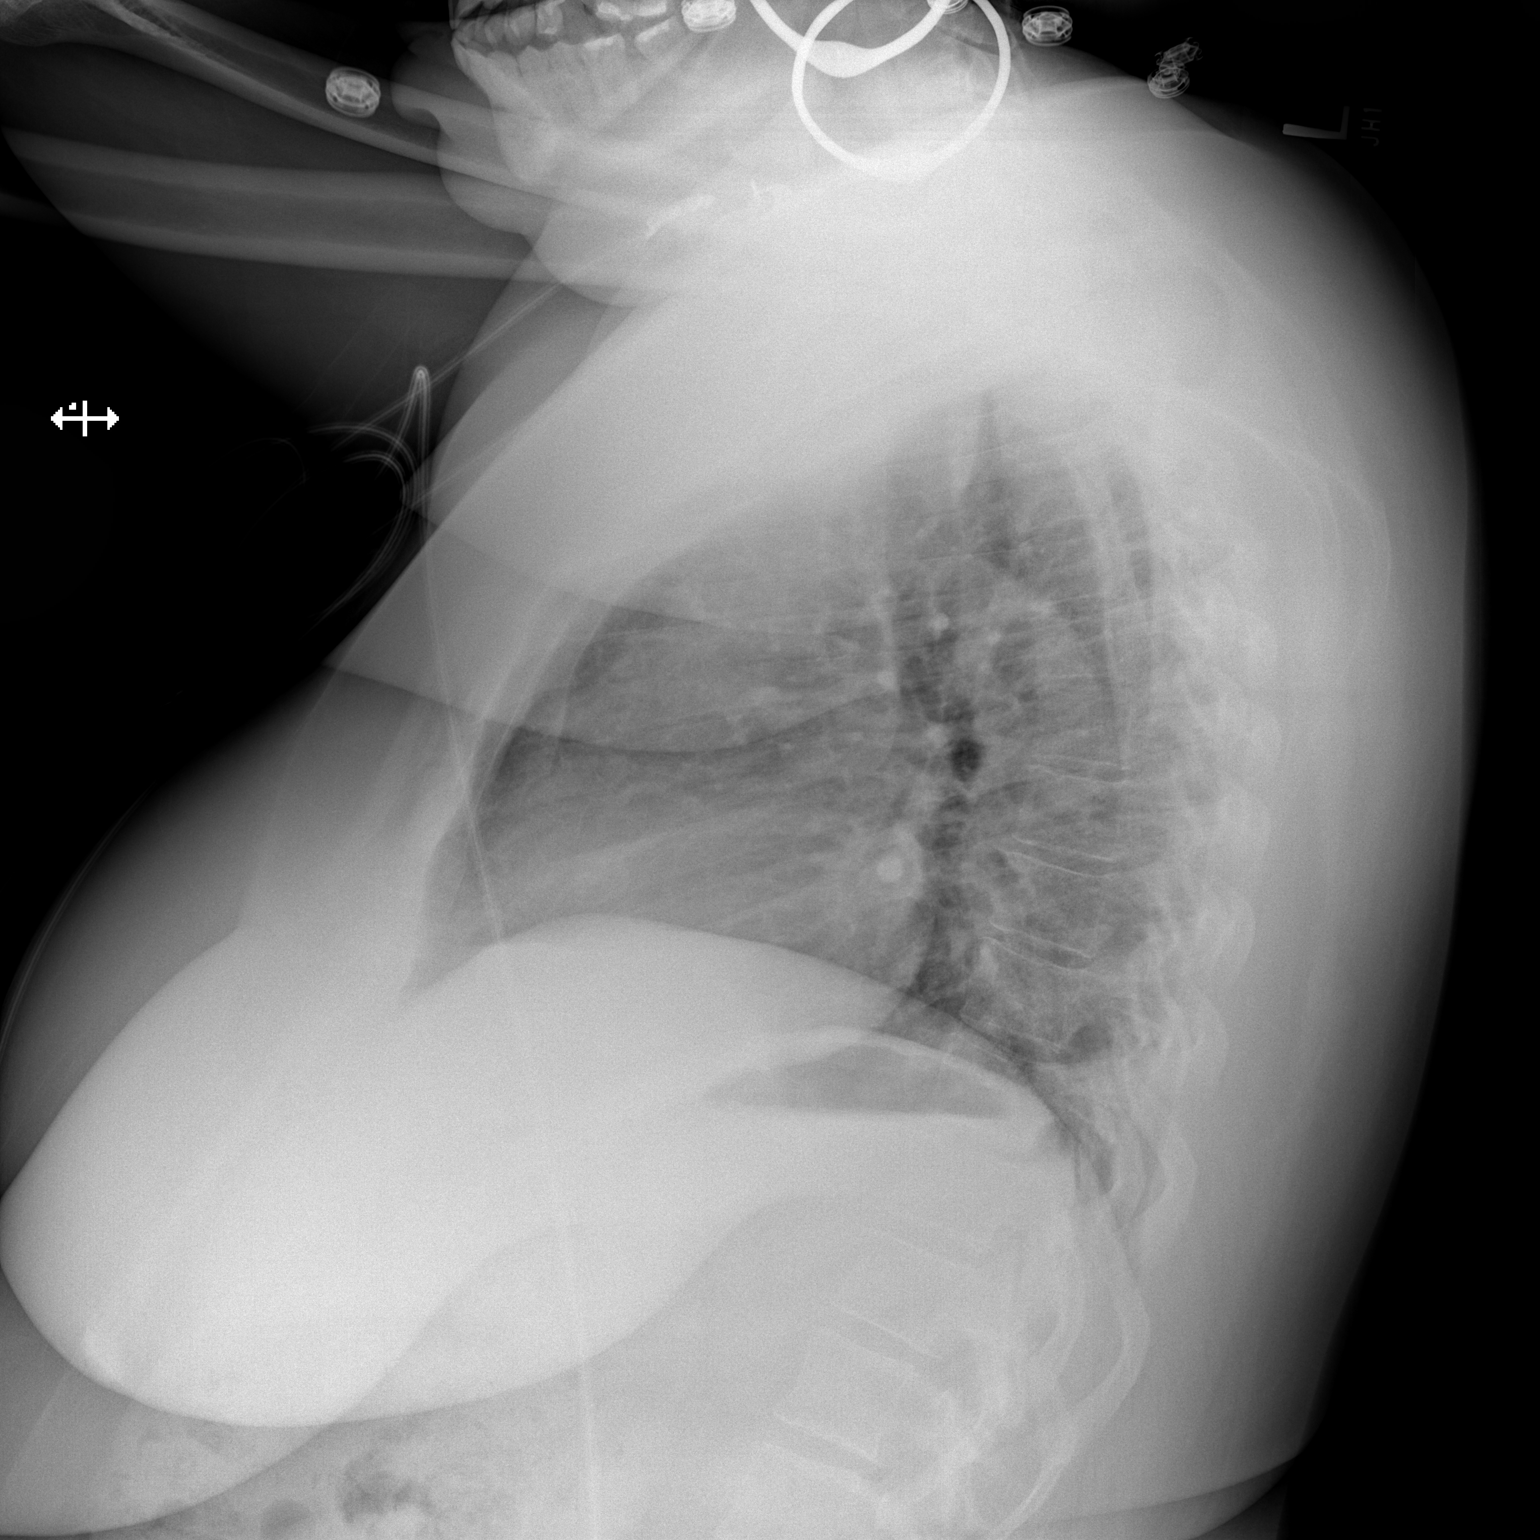

[2 of 2 positions shown; findings below may reference images not displayed]

FINDINGS: Slightly low lung volumes. No acute pulmonary consolidation, edema
or pneumothorax. Heart size is top-normal with minimal
atherosclerosis at the aortic arch. No aneurysm is noted. No acute
nor suspicious osseous lesions.
IMPRESSION: No active cardiopulmonary disease.
